# Patient Record
Sex: Male | Born: 1990 | Race: Black or African American | Hispanic: No | Marital: Single | Smoking: Never smoker
Health system: Southern US, Community
[De-identification: ages and names within clinical notes are randomized; demographics above are authoritative.]

## PROBLEM LIST (undated history)

## (undated) DIAGNOSIS — L309 Dermatitis, unspecified: Secondary | ICD-10-CM

---
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
## 2009-04-02 ENCOUNTER — Emergency Department (HOSPITAL_COMMUNITY): Admission: EM | Admit: 2009-04-02 | Discharge: 2009-04-03 | Payer: Self-pay | Admitting: Emergency Medicine

## 2009-04-13 ENCOUNTER — Inpatient Hospital Stay (HOSPITAL_COMMUNITY): Admission: EM | Admit: 2009-04-13 | Discharge: 2009-04-16 | Payer: Self-pay | Admitting: Emergency Medicine

## 2009-04-18 ENCOUNTER — Encounter (HOSPITAL_COMMUNITY): Admission: RE | Admit: 2009-04-18 | Discharge: 2009-05-18 | Payer: Self-pay | Admitting: Orthopaedic Surgery

## 2009-05-19 ENCOUNTER — Encounter (HOSPITAL_COMMUNITY): Admission: RE | Admit: 2009-05-19 | Discharge: 2009-06-18 | Payer: Self-pay | Admitting: Orthopaedic Surgery

## 2009-06-24 ENCOUNTER — Emergency Department (HOSPITAL_COMMUNITY): Admission: EM | Admit: 2009-06-24 | Discharge: 2009-06-24 | Payer: Self-pay | Admitting: Emergency Medicine

## 2010-05-16 LAB — CK TOTAL AND CKMB (NOT AT ARMC)
CK, MB: 3.6 ng/mL (ref 0.3–4.0)
Total CK: 465 U/L — ABNORMAL HIGH (ref 7–232)

## 2010-05-16 LAB — DIFFERENTIAL
Basophils Absolute: 0 10*3/uL (ref 0.0–0.1)
Basophils Relative: 0 % (ref 0–1)
Basophils Relative: 0 % (ref 0–1)
Eosinophils Absolute: 0.1 10*3/uL (ref 0.0–0.7)
Monocytes Relative: 10 % (ref 3–12)
Monocytes Relative: 14 % — ABNORMAL HIGH (ref 3–12)
Neutro Abs: 4.6 10*3/uL (ref 1.7–7.7)
Neutro Abs: 5.1 10*3/uL (ref 1.7–7.7)
Neutrophils Relative %: 59 % (ref 43–77)
Neutrophils Relative %: 63 % (ref 43–77)

## 2010-05-16 LAB — COMPREHENSIVE METABOLIC PANEL
Alkaline Phosphatase: 64 U/L (ref 39–117)
BUN: 16 mg/dL (ref 6–23)
CO2: 27 mEq/L (ref 19–32)
Calcium: 10 mg/dL (ref 8.4–10.5)
GFR calc non Af Amer: >60 mL/min (ref >60)
Glucose, Bld: 88 mg/dL (ref 70–99)
Potassium: 3.4 mEq/L — ABNORMAL LOW (ref 3.5–5.1)
Total Protein: 7.4 g/dL (ref 6.0–8.3)

## 2010-05-16 LAB — URINALYSIS, ROUTINE W REFLEX MICROSCOPIC
Hgb urine dipstick: NEGATIVE
Nitrite: NEGATIVE
Protein, ur: NEGATIVE mg/dL
Specific Gravity, Urine: 1.025 (ref 1.005–1.030)
Urobilinogen, UA: 1 mg/dL (ref 0.0–1.0)

## 2010-05-16 LAB — RAPID URINE DRUG SCREEN, HOSP PERFORMED
Amphetamines: NOT DETECTED
Barbiturates: NOT DETECTED
Benzodiazepines: NOT DETECTED
Opiates: NOT DETECTED

## 2010-05-16 LAB — CBC
MCHC: 33.1 g/dL (ref 30.0–36.0)
MCHC: 33.6 g/dL (ref 30.0–36.0)
MCV: 81.1 fL (ref 78.0–100.0)
RDW: 13.8 % (ref 11.5–15.5)
RDW: 13.9 % (ref 11.5–15.5)

## 2010-05-16 LAB — POCT CARDIAC MARKERS: Myoglobin, poc: >500 ng/mL (ref 12–200)

## 2010-05-16 LAB — ETHANOL: Alcohol, Ethyl (B): 116 mg/dL — ABNORMAL HIGH (ref 0–10)

## 2010-05-16 LAB — BASIC METABOLIC PANEL
BUN: 12 mg/dL (ref 6–23)
CO2: 30 mEq/L (ref 19–32)
Calcium: 8.9 mg/dL (ref 8.4–10.5)
Chloride: 107 mEq/L (ref 96–112)
Creatinine, Ser: 0.87 mg/dL (ref 0.4–1.5)
GFR calc Af Amer: >60 mL/min (ref >60)
Glucose, Bld: 99 mg/dL (ref 70–99)

## 2010-08-29 ENCOUNTER — Emergency Department (HOSPITAL_COMMUNITY)
Admission: EM | Admit: 2010-08-29 | Discharge: 2010-08-29 | Disposition: A | Discharge status: Home / Self Care | Payer: Self-pay | Attending: Emergency Medicine | Admitting: Emergency Medicine

## 2010-08-29 DIAGNOSIS — R51 Headache: Secondary | ICD-10-CM | POA: Insufficient documentation

## 2010-08-29 DIAGNOSIS — R42 Dizziness and giddiness: Secondary | ICD-10-CM | POA: Insufficient documentation

## 2010-08-29 DIAGNOSIS — R1110 Vomiting, unspecified: Secondary | ICD-10-CM | POA: Insufficient documentation

## 2010-08-29 LAB — DIFFERENTIAL
Basophils Absolute: 0 10*3/uL (ref 0.0–0.1)
Basophils Relative: 0 % (ref 0–1)
Eosinophils Absolute: 0.2 10*3/uL (ref 0.0–0.7)
Monocytes Absolute: 0.6 10*3/uL (ref 0.1–1.0)
Monocytes Relative: 14 % — ABNORMAL HIGH (ref 3–12)
Neutro Abs: 2.3 10*3/uL (ref 1.7–7.7)
Neutrophils Relative %: 51 % (ref 43–77)

## 2010-08-29 LAB — BASIC METABOLIC PANEL
Calcium: 9.4 mg/dL (ref 8.4–10.5)
GFR calc Af Amer: >60 mL/min (ref >60)
GFR calc non Af Amer: >60 mL/min (ref >60)
Potassium: 3.7 mEq/L (ref 3.5–5.1)
Sodium: 138 mEq/L (ref 135–145)

## 2010-08-29 LAB — CBC
Hemoglobin: 13 g/dL (ref 13.0–17.0)
MCH: 26.4 pg (ref 26.0–34.0)
MCHC: 32.7 g/dL (ref 30.0–36.0)
Platelets: 187 10*3/uL (ref 150–400)
RBC: 4.93 MIL/uL (ref 4.22–5.81)

## 2010-12-07 LAB — URINALYSIS, ROUTINE W REFLEX MICROSCOPIC
Glucose, UA: NEGATIVE
Hgb urine dipstick: NEGATIVE
Specific Gravity, Urine: 1.025
Urobilinogen, UA: 1

## 2010-12-07 LAB — DIFFERENTIAL
Lymphocytes Relative: 12 — ABNORMAL LOW
Monocytes Absolute: 0.6
Monocytes Relative: 8
Neutro Abs: 6.6
Neutrophils Relative %: 79 — ABNORMAL HIGH

## 2010-12-07 LAB — COMPREHENSIVE METABOLIC PANEL
Albumin: 4.3
BUN: 14
Calcium: 9.3
Creatinine, Ser: 1.03
Total Protein: 6.8

## 2010-12-07 LAB — CBC
HCT: 41.3
MCHC: 33.1
MCV: 80.1 — ABNORMAL LOW
Platelets: 199
RDW: 13.7

## 2011-02-07 ENCOUNTER — Encounter: Payer: Self-pay | Admitting: Emergency Medicine

## 2011-02-07 ENCOUNTER — Emergency Department (HOSPITAL_COMMUNITY)
Admission: EM | Admit: 2011-02-07 | Discharge: 2011-02-07 | Disposition: A | Discharge status: Home / Self Care | Payer: Self-pay | Attending: Emergency Medicine | Admitting: Emergency Medicine

## 2011-02-07 DIAGNOSIS — R110 Nausea: Secondary | ICD-10-CM | POA: Insufficient documentation

## 2011-02-07 DIAGNOSIS — R51 Headache: Secondary | ICD-10-CM | POA: Insufficient documentation

## 2011-02-07 MED ORDER — HYDROCODONE-ACETAMINOPHEN 5-325 MG PO TABS: 1.0000 | ORAL_TABLET | ORAL | Status: AC | PRN

## 2011-02-07 MED ORDER — HYDROMORPHONE HCL PF 1 MG/ML IJ SOLN
1.0000 mg | Freq: Once | INTRAMUSCULAR | Status: AC
  Administered 2011-02-07: 1 mg via INTRAMUSCULAR
  Filled 2011-02-07: qty 1

## 2011-02-07 MED ORDER — ONDANSETRON HCL 4 MG PO TABS: 4.0000 mg | ORAL_TABLET | Freq: Four times a day (QID) | ORAL | Status: AC

## 2011-02-07 MED ORDER — ONDANSETRON 8 MG PO TBDP
8.0000 mg | ORAL_TABLET | Freq: Once | ORAL | Status: AC
  Administered 2011-02-07: 8 mg via ORAL
  Filled 2011-02-07: qty 1

## 2011-02-07 NOTE — ED Provider Notes (Signed)
History  Scribed for Alexander Hutching, MD, the patient was seen in room APA08/APA08. This chart was scribed by Candelaria Stagers. The patient's care started at 6:25PM.    CSN: [id number] Arrival date & time: 02/07/2011  5:44 PM   First MD Initiated Contact with Patient 02/07/11 1808      Chief Complaint  Patient presents with  . Headache     The history is provided by the patient.   Alexander Acevedo is a 20 y.o. male who was BIBA complaining of a sharp stabbing headache around the left temple that has been intermittent over the last four days and has gotten worse today.  Pt has taken OTC sinus medication, Vicodin, and tylenol with no relief.  He states that the pain is a 10/10 and he does not regularly experience headaches.  He is experiencing no sinus pressure or pain.    History reviewed. No pertinent past medical history.  History reviewed. No pertinent past surgical history.  History reviewed. No pertinent family history.  History  Substance Use Topics  . Smoking status: Not on file  . Smokeless tobacco: Not on file  . Alcohol Use: Not on file      Review of Systems  Constitutional: Negative for fever and chills.  HENT: Negative for rhinorrhea, neck pain and sinus pressure.   Eyes: Negative for pain.  Respiratory: Negative for cough and shortness of breath.   Cardiovascular: Negative for chest pain.  Gastrointestinal: Positive for nausea. Negative for vomiting, abdominal pain and diarrhea.  Genitourinary: Negative for dysuria.  Musculoskeletal: Negative for back pain.  Skin: Negative for rash.  Neurological: Positive for headaches. Negative for dizziness and weakness.    Allergies  Review of patient's allergies indicates no known allergies.  Home Medications  No current outpatient prescriptions on file.  BP 125/77  Pulse 69  Temp(Src) 98.8 F (37.1 C) (Oral)  Resp 20  Wt 170 lb (77.111 kg)  SpO2 100%  Physical Exam  Nursing note and vitals  reviewed. Constitutional: He is oriented to person, place, and time. He appears well-developed and well-nourished. No distress.  HENT:  Head: Normocephalic and atraumatic.  Eyes: EOM are normal. Pupils are equal, round, and reactive to light.  Neck: Neck supple. No tracheal deviation present.  Cardiovascular: Normal rate.   Pulmonary/Chest: Effort normal. No respiratory distress.  Abdominal: Soft. He exhibits no distension.  Musculoskeletal: Normal range of motion. He exhibits no edema.  Neurological: He is alert and oriented to person, place, and time. No sensory deficit.  Skin: Skin is warm and dry.  Psychiatric: He has a normal mood and affect. His behavior is normal.    ED Course  Procedures   Will give Zofran for nausea and dilaudid injection for pain.    DIAGNOSTIC STUDIES: Oxygen Saturation is 100% on room air, normal by my interpretation.    COORDINATION OF CARE:  6:27PM ondansetron (ZOFRAN-ODT) disintegrating tablet 8 mg, HYDROmorphone (DILAUDID) injection 1 mg     Labs Reviewed - No data to display No results found.   No diagnosis found.    MDM  No neurological deficits on physical exam. Feeling better after injection for pain   I personally performed the services described in this documentation, which was scribed in my presence. The recorded information has been reviewed and considered.         Alexander Hutching, MD 02/07/11 [phone]

## 2011-02-07 NOTE — ED Notes (Signed)
Pt c/o l side ha x 4 days-worse when bending down. Pt c/o n/v today. nad noted.

## 2011-02-07 NOTE — ED Notes (Signed)
A&ox4; in no apparent distress; instructions given and reviewed-verbalizes understanding.

## 2011-02-07 NOTE — ED Notes (Signed)
Received report agree with previous assessment 

## 2011-05-23 ENCOUNTER — Encounter (HOSPITAL_COMMUNITY): Payer: Self-pay | Admitting: *Deleted

## 2011-05-23 ENCOUNTER — Emergency Department (HOSPITAL_COMMUNITY)
Admission: EM | Admit: 2011-05-23 | Discharge: 2011-05-23 | Disposition: A | Discharge status: Home / Self Care | Payer: Self-pay | Attending: Emergency Medicine | Admitting: Emergency Medicine

## 2011-05-23 DIAGNOSIS — L309 Dermatitis, unspecified: Secondary | ICD-10-CM

## 2011-05-23 DIAGNOSIS — L259 Unspecified contact dermatitis, unspecified cause: Secondary | ICD-10-CM | POA: Insufficient documentation

## 2011-05-23 HISTORY — DX: Dermatitis, unspecified: L30.9

## 2011-05-23 MED ORDER — TRIAMCINOLONE ACETONIDE 0.1 % EX LOTN: TOPICAL_LOTION | Freq: Three times a day (TID) | CUTANEOUS | Status: DC

## 2011-05-23 MED ORDER — DIPHENHYDRAMINE HCL 25 MG PO CAPS: 25.0000 mg | ORAL_CAPSULE | ORAL | Status: DC | PRN

## 2011-05-23 MED ORDER — PREDNISONE 20 MG PO TABS
60.0000 mg | ORAL_TABLET | Freq: Once | ORAL | Status: AC
  Administered 2011-05-23: 60 mg via ORAL
  Filled 2011-05-23: qty 3

## 2011-05-23 MED ORDER — DIPHENHYDRAMINE HCL 25 MG PO CAPS
25.0000 mg | ORAL_CAPSULE | Freq: Once | ORAL | Status: AC
  Administered 2011-05-23: 25 mg via ORAL
  Filled 2011-05-23: qty 1

## 2011-05-23 MED ORDER — PREDNISONE 20 MG PO TABS: ORAL_TABLET | ORAL | Status: DC

## 2011-05-23 NOTE — Discharge Instructions (Signed)
Eczema Atopic dermatitis, or eczema, is an inherited type of sensitive skin. Often people with eczema have a family history of allergies, asthma, or hay fever. It causes a red itchy rash and dry scaly skin. The itchiness may occur before the skin rash and may be very intense. It is not contagious. Eczema is generally worse during the cooler winter months and often improves with the warmth of summer. Eczema usually starts showing signs in infancy. Some children outgrow eczema, but it may last through adulthood. Flare-ups may be caused by:  Eating something or contact with something you are sensitive or allergic to.   Stress.  DIAGNOSIS  The diagnosis of eczema is usually based upon symptoms and medical history. TREATMENT  Eczema cannot be cured, but symptoms usually can be controlled with treatment or avoidance of allergens (things to which you are sensitive or allergic to).  Controlling the itching and scratching.   Use over-the-counter antihistamines as directed for itching. It is especially useful at night when the itching tends to be worse.   Use over-the-counter steroid creams as directed for itching.   Scratching makes the rash and itching worse and may cause impetigo (a skin infection) if fingernails are contaminated (dirty).   Keeping the skin well moisturized with creams every day. This will seal in moisture and help prevent dryness. Lotions containing alcohol and water can dry the skin and are not recommended.   Limiting exposure to allergens.   Recognizing situations that cause stress.   Developing a plan to manage stress.  HOME CARE INSTRUCTIONS   Take prescription and over-the-counter medicines as directed by your caregiver.   Do not use anything on the skin without checking with your caregiver.   Keep baths or showers short (5 minutes) in warm (not hot) water. Use mild cleansers for bathing. You may add non-perfumed bath oil to the bath water. It is best to avoid soap and  bubble bath.   Immediately after a bath or shower, when the skin is still damp, apply a moisturizing ointment to the entire body. This ointment should be a petroleum ointment. This will seal in moisture and help prevent dryness. The thicker the ointment the better. These should be unscented.   Keep fingernails cut short and wash hands often. If your child has eczema, it may be necessary to put soft gloves or mittens on your child at night.   Dress in clothes made of cotton or cotton blends. Dress lightly, as heat increases itching.   Avoid foods that may cause flare-ups. Common foods include cow's milk, peanut butter, eggs and wheat.   Keep a child with eczema away from anyone with fever blisters. The virus that causes fever blisters (herpes simplex) can cause a serious skin infection in children with eczema.  SEEK MEDICAL CARE IF:   Itching interferes with sleep.   The rash gets worse or is not better within one week following treatment.   The rash looks infected (pus or soft yellow scabs).   You or your child has an oral temperature above 102 F (38.9 C).   Your baby is older than 3 months with a rectal temperature of 100.5 F (38.1 C) or higher for more than 1 day.   The rash flares up after contact with someone who has fever blisters.  SEEK IMMEDIATE MEDICAL CARE IF:   Your baby is older than 3 months with a rectal temperature of 102 F (38.9 C) or higher.   Your baby is older than  3 months or younger with a rectal temperature of 100.4 F (38 C) or higher.  Document Released: 02/09/2000 Document Revised: 01/31/2011 Document Reviewed: 12/14/2008 [facility] Patient Information 2012 [place], [place].   Take your next dose of prednisone tomorrow.  Use the steroid cream only on those places such as hands and face that are bothering him the most, but use sparingly on the face and not more than 1 week on your facial skin.  Use Benadryl for itching.  Take cool showers and moisturizers  skin using Eucerin or Keri lotion before a towel drying.  Call Dr. Margo Aye for further management of your symptoms if they do not improve, or return here.

## 2011-05-23 NOTE — ED Notes (Signed)
Pt Dc to home with steady gait. 

## 2011-05-23 NOTE — ED Notes (Signed)
Eczema flare for weeks.

## 2011-05-24 NOTE — ED Provider Notes (Signed)
History     CSN: [id number]  Arrival date & time 05/23/11  1337   First MD Initiated Contact with Patient 05/23/11 1414      Chief Complaint  Patient presents with  . Rash    (Consider location/radiation/quality/duration/timing/severity/associated sxs/prior treatment) Patient is a 21 y.o. male presenting with rash. The history is provided by the patient.  Rash  This is a recurrent problem. The current episode started more than 1 week ago. The problem has been gradually worsening. Associated with: Patient reports reports chronic history of eczema, which has recently flared over the past several weeks. There has been no fever. The rash is present on the face, left wrist, left hand, left arm, neck, right arm, right hand, right wrist and trunk (He also has patches behind both his knees.). The pain is at a severity of 0/10. The patient is experiencing no pain. Associated symptoms include itching. Pertinent negatives include no blisters, no pain and no weeping. He has tried nothing for the symptoms.    Past Medical History  Diagnosis Date  . Eczema     History reviewed. No pertinent past surgical history.  Family History  Problem Relation Age of Onset  . Diabetes Mother     History  Substance Use Topics  . Smoking status: Never Smoker   . Smokeless tobacco: Not on file  . Alcohol Use: No      Review of Systems  Constitutional: Negative for fever.  HENT: Negative for congestion, sore throat and neck pain.   Eyes: Negative.   Respiratory: Negative for chest tightness and shortness of breath.   Cardiovascular: Negative for chest pain.  Gastrointestinal: Negative for nausea and abdominal pain.  Genitourinary: Negative.   Musculoskeletal: Negative for joint swelling and arthralgias.  Skin: Positive for itching and rash. Negative for wound.  Neurological: Negative for dizziness, weakness, light-headedness, numbness and headaches.  Hematological: Negative.     Psychiatric/Behavioral: Negative.     Allergies  Review of patient's allergies indicates no known allergies.  Home Medications   Current Outpatient Rx  Name Route Sig Dispense Refill  . ACETAMINOPHEN 500 MG PO TABS Oral Take 500 mg by mouth as needed. For pain     . DIPHENHYDRAMINE HCL 25 MG PO CAPS Oral Take 1 capsule (25 mg total) by mouth every 4 (four) hours as needed for itching. 30 capsule 0  . PREDNISONE 20 MG PO TABS  Take 6 tabs daily by mouth for 2 day,  Then 5 tabs daily for 2 days,  4 tabs daily for 2 days,  3 tabs daily for 2 days,  2 tabs daily for 2 days,  Then 1 tab daily for 2 days. 42 tablet 0  . SINUS RELIEF PO Oral Take 2 tablets by mouth daily as needed. For cold symptoms     . TRIAMCINOLONE ACETONIDE 0.1 % EX LOTN Topical Apply topically 3 (three) times daily. 60 mL 0    BP 128/67  Pulse 61  Temp(Src) 98.1 F (36.7 C) (Oral)  Resp 18  Ht 5\' 8"  (1.727 m)  Wt 180 lb (81.647 kg)  BMI 27.37 kg/m2  SpO2 100%  Physical Exam  Nursing note and vitals reviewed. Constitutional: He is oriented to person, place, and time. He appears well-developed and well-nourished.  HENT:  Head: Normocephalic and atraumatic.  Eyes: Conjunctivae are normal.  Neck: Neck supple.  Cardiovascular: Normal rate, regular rhythm and normal heart sounds.   Pulmonary/Chest: Effort normal and breath sounds normal. He has no  wheezes.  Musculoskeletal: Normal range of motion.  Neurological: He is alert and oriented to person, place, and time.  Skin: Skin is warm and dry. Rash noted. Rash is macular.       Scattered areas of raised and scaly plaques nearly confluent on neck wrists and hands and popliteal spaces.  Also involving entire face, which is more your edematous and scaling, with less intense plaques as seen on other areas.  Scattered excoriations present.  No erythema and no drainage noted, no papules pustules or vesicles noted.  Psychiatric: He has a normal mood and affect.    ED  Course  Procedures (including critical care time)  Labs Reviewed - No data to display No results found.   1. Eczema     Patient given Benadryl 25 mg by mouth and prednisone 60 mg by mouth.  MDM  Prescribed Benadryl, 12 date 60 mg  and prednisone taper, and also prescribed triamcinolone 0.1% cream to use very sparingly twice daily on the most troublesome patches.  He was referred to Dr. Margo Aye for further management if his eczema does not get under control with this treatment.  Also encouraged to take short cool showers, followed by copious moisturizer before drying off which may help also improve his rash.        Candis Musa, PA 05/24/11 0754  Candis Musa, PA 05/24/11 [phone]

## 2011-05-29 NOTE — ED Provider Notes (Signed)
Medical screening examination/treatment/procedure(s) were performed by non-physician practitioner and as supervising physician I was immediately available for consultation/collaboration.   Sondi Desch W Yidel Teuscher, MD 05/29/11 1423 

## 2011-05-30 ENCOUNTER — Encounter (HOSPITAL_COMMUNITY): Payer: Self-pay

## 2011-05-30 ENCOUNTER — Emergency Department (HOSPITAL_COMMUNITY)
Admission: EM | Admit: 2011-05-30 | Discharge: 2011-05-30 | Disposition: A | Discharge status: Home / Self Care | Payer: Self-pay | Attending: Emergency Medicine | Admitting: Emergency Medicine

## 2011-05-30 DIAGNOSIS — L259 Unspecified contact dermatitis, unspecified cause: Secondary | ICD-10-CM | POA: Insufficient documentation

## 2011-05-30 DIAGNOSIS — L309 Dermatitis, unspecified: Secondary | ICD-10-CM

## 2011-05-30 DIAGNOSIS — Z760 Encounter for issue of repeat prescription: Secondary | ICD-10-CM | POA: Insufficient documentation

## 2011-05-30 MED ORDER — TRIAMCINOLONE ACETONIDE 0.1 % EX CREA: TOPICAL_CREAM | Freq: Three times a day (TID) | CUTANEOUS | Status: DC

## 2011-05-30 NOTE — ED Provider Notes (Signed)
History     CSN: [id number]  Arrival date & time 05/30/11  4098   First MD Initiated Contact with Patient 05/30/11 1013      Chief Complaint  Patient presents with  . Eczema    (Consider location/radiation/quality/duration/timing/severity/associated sxs/prior treatment) HPI Comments: Patient seen here one week ago for same sx's and states he received a cream that was improving his symptoms but states he was prescribed a small amt of the cream and he has ran out.  He denies any worsening symptoms.  Patient has a hx of recurrent flare-ups of eczema.  He denies fever, drainage or edema  Patient is a 21 y.o. male presenting with rash. The history is provided by the patient and a parent.  Rash  This is a recurrent problem. The current episode started more than 2 days ago. The problem has been gradually improving. Associated with: hx of ecezma. There has been no fever. The rash is present on the face, left hand, right hand and trunk. The patient is experiencing no pain. Associated symptoms include itching. Pertinent negatives include no blisters, no pain and no weeping. He has tried steriods for the symptoms. The treatment provided mild relief.    Past Medical History  Diagnosis Date  . Eczema     History reviewed. No pertinent past surgical history.  Family History  Problem Relation Age of Onset  . Diabetes Mother     History  Substance Use Topics  . Smoking status: Never Smoker   . Smokeless tobacco: Not on file  . Alcohol Use: No      Review of Systems  Constitutional: Negative for fever.  HENT: Negative for facial swelling, neck pain and neck stiffness.   Musculoskeletal: Negative for joint swelling.  Skin: Positive for itching and rash.  Neurological: Negative for weakness and numbness.  All other systems reviewed and are negative.    Allergies  Review of patient's allergies indicates no known allergies.  Home Medications   Current Outpatient Rx  Name Route  Sig Dispense Refill  . ACETAMINOPHEN 500 MG PO TABS Oral Take 500 mg by mouth as needed. For pain     . DIPHENHYDRAMINE HCL 25 MG PO CAPS Oral Take 1 capsule (25 mg total) by mouth every 4 (four) hours as needed for itching. 30 capsule 0  . PREDNISONE 20 MG PO TABS  Take 6 tabs daily by mouth for 2 day,  Then 5 tabs daily for 2 days,  4 tabs daily for 2 days,  3 tabs daily for 2 days,  2 tabs daily for 2 days,  Then 1 tab daily for 2 days. 42 tablet 0  . SINUS RELIEF PO Oral Take 2 tablets by mouth daily as needed. For cold symptoms     . TRIAMCINOLONE ACETONIDE 0.1 % EX LOTN Topical Apply topically 3 (three) times daily. 60 mL 0    BP 117/64  Pulse 50  Temp(Src) 97.9 F (36.6 C) (Oral)  Resp 18  SpO2 100%  Physical Exam  Nursing note and vitals reviewed. Constitutional: He is oriented to person, place, and time. He appears well-developed and well-nourished. No distress.  HENT:  Head: Normocephalic and atraumatic.  Mouth/Throat: Oropharynx is clear and moist.  Cardiovascular: Normal rate, regular rhythm and normal heart sounds.   Pulmonary/Chest: Effort normal and breath sounds normal. No respiratory distress.  Musculoskeletal: Normal range of motion. He exhibits no edema and no tenderness.  Lymphadenopathy:    He has no cervical adenopathy.  Neurological:  He is alert and oriented to person, place, and time. He exhibits normal muscle tone. Coordination normal.  Skin: Skin is warm and dry. Rash noted.       Slightly raised plaques to the bilateral dorsal hands, arms and trunk.  Mild scaling also present.  No erythema or pustules    ED Course  Procedures (including critical care time)       MDM     Previous ED chart and nursing notes were reviewed by me.    Patient with hx of eczema was seen here last week and given triamcinolone cream.  Returns today for refill.  Has appt with dermatology, Dr. Margo Aye, in one week.    Patient / Family / Caregiver understand and agree with  initial ED impression and plan with expectations set for ED visit. Pt stable in ED with no significant deterioration in condition. Pt feels improved after observation and/or treatment in ED.        Imogean Ciampa L. Kreig Parson, [place] 06/04/11 1439

## 2011-05-30 NOTE — ED Notes (Signed)
Pt reports being here Saturday and was dx with eczema.  Pt reports running out of his cream that was prescribed to him.  Pt here to get another prescription.  Pt denies any itching or discomfort.

## 2011-05-30 NOTE — Discharge Instructions (Signed)

## 2011-06-07 NOTE — ED Provider Notes (Signed)
Medical screening examination/treatment/procedure(s) were performed by non-physician practitioner and as supervising physician I was immediately available for consultation/collaboration.   Darrell Leonhardt L Ariana Cavenaugh, MD 06/07/11 1817 

## 2012-04-24 ENCOUNTER — Emergency Department (HOSPITAL_COMMUNITY)
Admission: EM | Admit: 2012-04-24 | Discharge: 2012-04-24 | Disposition: A | Discharge status: Home / Self Care | Payer: Self-pay | Attending: Emergency Medicine | Admitting: Emergency Medicine

## 2012-04-24 ENCOUNTER — Encounter (HOSPITAL_COMMUNITY): Payer: Self-pay | Admitting: Emergency Medicine

## 2012-04-24 DIAGNOSIS — R6883 Chills (without fever): Secondary | ICD-10-CM | POA: Insufficient documentation

## 2012-04-24 DIAGNOSIS — Z872 Personal history of diseases of the skin and subcutaneous tissue: Secondary | ICD-10-CM | POA: Insufficient documentation

## 2012-04-24 DIAGNOSIS — R112 Nausea with vomiting, unspecified: Secondary | ICD-10-CM | POA: Insufficient documentation

## 2012-04-24 DIAGNOSIS — R109 Unspecified abdominal pain: Secondary | ICD-10-CM | POA: Insufficient documentation

## 2012-04-24 LAB — COMPREHENSIVE METABOLIC PANEL
ALT: 21 U/L (ref 0–53)
AST: 22 U/L (ref 0–37)
Alkaline Phosphatase: 83 U/L (ref 39–117)
CO2: 28 mEq/L (ref 19–32)
Calcium: 10 mg/dL (ref 8.4–10.5)
GFR calc Af Amer: >90 mL/min (ref >90)
Glucose, Bld: 117 mg/dL — ABNORMAL HIGH (ref 70–99)
Potassium: 3.6 mEq/L (ref 3.5–5.1)
Sodium: 137 mEq/L (ref 135–145)
Total Protein: 8.2 g/dL (ref 6.0–8.3)

## 2012-04-24 LAB — URINALYSIS, ROUTINE W REFLEX MICROSCOPIC
Glucose, UA: NEGATIVE mg/dL
Leukocytes, UA: NEGATIVE
Nitrite: NEGATIVE
pH: 8 (ref 5.0–8.0)

## 2012-04-24 LAB — CBC
Hemoglobin: 15.2 g/dL (ref 13.0–17.0)
MCH: 27 pg (ref 26.0–34.0)
MCHC: 33.9 g/dL (ref 30.0–36.0)
Platelets: 187 10*3/uL (ref 150–400)
RBC: 5.62 MIL/uL (ref 4.22–5.81)

## 2012-04-24 LAB — URINE MICROSCOPIC-ADD ON

## 2012-04-24 MED ORDER — MORPHINE SULFATE 4 MG/ML IJ SOLN
4.0000 mg | Freq: Once | INTRAMUSCULAR | Status: AC
  Administered 2012-04-24: 4 mg via INTRAVENOUS
  Filled 2012-04-24: qty 1

## 2012-04-24 MED ORDER — ONDANSETRON HCL 4 MG/2ML IJ SOLN
4.0000 mg | Freq: Once | INTRAMUSCULAR | Status: AC
  Administered 2012-04-24: 4 mg via INTRAVENOUS
  Filled 2012-04-24: qty 2

## 2012-04-24 MED ORDER — FAMOTIDINE IN NACL 20-0.9 MG/50ML-% IV SOLN
20.0000 mg | INTRAVENOUS | Status: AC
  Administered 2012-04-24: 20 mg via INTRAVENOUS
  Filled 2012-04-24: qty 50

## 2012-04-24 MED ORDER — SODIUM CHLORIDE 0.9 % IV BOLUS (SEPSIS)
1000.0000 mL | Freq: Once | INTRAVENOUS | Status: AC
  Administered 2012-04-24: 1000 mL via INTRAVENOUS

## 2012-04-24 MED ORDER — ONDANSETRON 4 MG PO TBDP: 4.0000 mg | ORAL_TABLET | Freq: Three times a day (TID) | ORAL | Status: DC | PRN

## 2012-04-24 MED ORDER — FAMOTIDINE 20 MG PO TABS: 20.0000 mg | ORAL_TABLET | Freq: Two times a day (BID) | ORAL | Status: DC

## 2012-04-24 NOTE — ED Notes (Signed)
Pt states that he is feeling better, updated on plan of care.

## 2012-04-24 NOTE — ED Notes (Signed)
Pt given crackers, ginger ale,

## 2012-04-24 NOTE — ED Provider Notes (Addendum)
History  This chart was scribed for Alexander Roller, MD by Shari Heritage, ED Scribe. The patient was seen in room APA07/APA07. Patient's care was started at 0754.   CSN: [id number]  Arrival date & time 04/24/12  1478   First MD Initiated Contact with Patient 04/24/12 [phone]      Chief Complaint  Patient presents with  . Nausea  . Emesis    The history is provided by the patient. No language interpreter was used.    HPI Comments: Alexander Acevedo is a 22 y.o. male who presents to the Emergency Department complaining of sudden epigastric abdominal pain and emesis that began last night. Abdominal pain is moderate, throbbing and non-radiating. He rates pain as 8/10. He says that abdominal pain began prior to vomiting. He reports 7 episodes of vomiting since symptom onset. Patient states that he felt normal when he woke up yesterday morning. He says that last night he ate a sandwich from [employer]. Patient had two reported glasses of an alcoholic drink but cannot recall exactly what he drank; he states that he normally does not drink. He denies any sick contacts and states everyone at home is healthy. He denies diarrhea, back pain, chest pain, shortness of breath, fever, difficulty urinating, testicular pain, hematuria, blood in stool, arthralgias, myalgias or rash. Patient does not smoke cigarettes.  Past Medical History  Diagnosis Date  . Eczema     History reviewed. No pertinent past surgical history.  Family History  Problem Relation Age of Onset  . Diabetes Mother     History  Substance Use Topics  . Smoking status: Never Smoker   . Smokeless tobacco: Not on file  . Alcohol Use: No      Review of Systems  Constitutional: Positive for chills. Negative for fever.  HENT: Negative for sore throat and neck pain.   Eyes: Negative for visual disturbance.  Respiratory: Negative for cough and shortness of breath.   Cardiovascular: Negative for chest pain.  Gastrointestinal: Positive for  vomiting and abdominal pain. Negative for nausea, diarrhea and blood in stool.  Genitourinary: Negative for dysuria, frequency, hematuria, difficulty urinating and testicular pain.  Musculoskeletal: Negative for myalgias, back pain and arthralgias.  Skin: Negative for rash.  Neurological: Negative for weakness, numbness and headaches.  Hematological: Negative for adenopathy.  Psychiatric/Behavioral: Negative for behavioral problems.  All other systems reviewed and are negative.    Allergies  Review of patient's allergies indicates no known allergies.  Home Medications   Current Outpatient Rx  Name  Route  Sig  Dispense  Refill  . famotidine (PEPCID) 20 MG tablet   Oral   Take 1 tablet (20 mg total) by mouth 2 (two) times daily.   30 tablet   0   . ondansetron (ZOFRAN ODT) 4 MG disintegrating tablet   Oral   Take 1 tablet (4 mg total) by mouth every 8 (eight) hours as needed for nausea.   10 tablet   0     Triage Vitals: BP 130/66  Pulse 104  Temp(Src) 98.9 F (37.2 C) (Oral)  Ht 5\' 8"  (1.727 m)  Wt 160 lb (72.576 kg)  BMI 24.33 kg/m2  SpO2 97%  Physical Exam  Constitutional: He is oriented to person, place, and time. He appears well-developed and well-nourished.  HENT:  Head: Normocephalic and atraumatic.  Mouth/Throat: Oropharynx is clear and moist.  Eyes: Conjunctivae are normal. Pupils are equal, round, and reactive to light. No scleral icterus.  Neck: Normal range  of motion. Neck supple.  Cardiovascular: Normal rate, regular rhythm and normal heart sounds.  Exam reveals no gallop and no friction rub.   No murmur heard. Pulmonary/Chest: Effort normal and breath sounds normal. No respiratory distress. He has no wheezes. He has no rales.  Abdominal: Soft. Bowel sounds are normal. He exhibits no distension. There is tenderness in the right upper quadrant and epigastric area. There is no rebound and no guarding.  Mild epigastric and RUQ tenderness. No peritoneal  signs. BS diminished.  Genitourinary:  No CVA tenderness.  Musculoskeletal: Normal range of motion.  Neurological: He is alert and oriented to person, place, and time.  Skin: Skin is warm and dry. No rash noted.    ED Course  Procedures (including critical care time) DIAGNOSTIC STUDIES: Oxygen Saturation is 97% on room air, adequate by my interpretation.    COORDINATION OF CARE: 8:20 AM- Patient informed of current plan for treatment and evaluation and agrees with plan at this time.      Labs Reviewed  URINALYSIS, ROUTINE W REFLEX MICROSCOPIC - Abnormal; Notable for the following:    Protein, ur TRACE (*)    All other components within normal limits  CBC - Abnormal; Notable for the following:    WBC 11.7 (*)    All other components within normal limits  COMPREHENSIVE METABOLIC PANEL - Abnormal; Notable for the following:    Glucose, Bld 117 (*)    All other components within normal limits  URINE MICROSCOPIC-ADD ON  LIPASE, BLOOD   No results found.   1. Nausea and vomiting       MDM  22 yo male with abdominal pain and vomiting after consuming unknown quantity of ethanol. DDx: gastritis, pancreatitis, viral gastroenteritis, cannot rule out early appy but less likely on differential. UA negative. Labs, fluids, pain/nausea control, Pepcid, reassess. Patient needs to be seen again in 24 hours if discharged for repeat abdominal examination.  Labs normal - pt improved significantly with lfuids and meds, stable for d/c.  Doubt acute abdominal pathology.   I personally performed the services described in this documentation, which was scribed in my presence. The recorded information has been reviewed and is accurate.     Alexander Roller, MD 04/24/12 1017  Alexander Roller, MD 04/24/12 1018

## 2012-04-24 NOTE — ED Notes (Signed)
N/v since last night. Pt states sister sick with same.denies diarrhea, fever.

## 2012-04-24 NOTE — ED Notes (Signed)
Pt presents to er with c/o n/v left lower abd pain, chills that started last night, advises that his sister is sick with same symptoms, last bowel movement was yesterday prior to the n/v starting and normal per pt.

## 2012-11-16 ENCOUNTER — Emergency Department (HOSPITAL_COMMUNITY)
Admission: EM | Admit: 2012-11-16 | Discharge: 2012-11-16 | Disposition: A | Discharge status: Home / Self Care | Payer: Self-pay | Attending: Emergency Medicine | Admitting: Emergency Medicine

## 2012-11-16 ENCOUNTER — Encounter (HOSPITAL_COMMUNITY): Payer: Self-pay | Admitting: *Deleted

## 2012-11-16 DIAGNOSIS — Z79899 Other long term (current) drug therapy: Secondary | ICD-10-CM | POA: Insufficient documentation

## 2012-11-16 DIAGNOSIS — R112 Nausea with vomiting, unspecified: Secondary | ICD-10-CM | POA: Insufficient documentation

## 2012-11-16 DIAGNOSIS — Z872 Personal history of diseases of the skin and subcutaneous tissue: Secondary | ICD-10-CM | POA: Insufficient documentation

## 2012-11-16 DIAGNOSIS — R197 Diarrhea, unspecified: Secondary | ICD-10-CM | POA: Insufficient documentation

## 2012-11-16 MED ORDER — PROMETHAZINE HCL 25 MG PO TABS: 25.0000 mg | ORAL_TABLET | Freq: Four times a day (QID) | ORAL | Status: DC | PRN

## 2012-11-16 MED ORDER — LOPERAMIDE HCL 2 MG PO CAPS: 2.0000 mg | ORAL_CAPSULE | Freq: Four times a day (QID) | ORAL | Status: DC | PRN

## 2012-11-16 MED ORDER — LOPERAMIDE HCL 2 MG PO CAPS
2.0000 mg | ORAL_CAPSULE | Freq: Once | ORAL | Status: AC
  Administered 2012-11-16: 2 mg via ORAL
  Filled 2012-11-16: qty 1

## 2012-11-16 MED ORDER — SODIUM CHLORIDE 0.9 % IV BOLUS (SEPSIS)
1000.0000 mL | Freq: Once | INTRAVENOUS | Status: AC
  Administered 2012-11-16: 1000 mL via INTRAVENOUS

## 2012-11-16 MED ORDER — BISMUTH SUBSALICYLATE 262 MG/15ML PO SUSP
30.0000 mL | Freq: Once | ORAL | Status: AC
  Administered 2012-11-16: 30 mL via ORAL
  Filled 2012-11-16: qty 236

## 2012-11-16 MED ORDER — BISMUTH SUBSALICYLATE 262 MG/15ML PO SUSP
ORAL | Status: AC
  Filled 2012-11-16: qty 236

## 2012-11-16 NOTE — ED Provider Notes (Signed)
CSN: [id number]     Arrival date & time 11/16/12  1714 History  This chart was scribed for [employer]. Rubin Payor, MD by Quintella Reichert, ED scribe.  This patient was seen in room APA03/[facility]03 and the patient's care was started at 7:13 PM.  Chief Complaint: Diarrhea  The history is provided by the patient. No language interpreter was used.    HPI Comments: Alexander Acevedo is a 22 y.o. male who presents to the Emergency Department complaining of persistent moderate diarrhea that began yesterday evening after eating pizza.  Stool is described as watery diarrhea.  Pt denies blood in stool.  He states he is able to eat and drink but "everything I eat is coming out."  He also states he has vomited one time.  He denies nausea, fever, chills, lightheadedness or dizziness.  He denies recent sick contacts or travel.   Past Medical History  Diagnosis Date  . Eczema     History reviewed. No pertinent past surgical history.   Family History  Problem Relation Age of Onset  . Diabetes Mother     History  Substance Use Topics  . Smoking status: Never Smoker   . Smokeless tobacco: Not on file  . Alcohol Use: No     Review of Systems  Constitutional: Negative for fever and chills.  Gastrointestinal: Positive for vomiting and diarrhea. Negative for nausea.  Neurological: Negative for dizziness and light-headedness.  All other systems reviewed and are negative.     Allergies  Review of patient's allergies indicates no known allergies.  Home Medications   Current Outpatient Rx  Name  Route  Sig  Dispense  Refill  . loperamide (IMODIUM) 2 MG capsule   Oral   Take 1 capsule (2 mg total) by mouth 4 (four) times daily as needed for diarrhea or loose stools.   12 capsule   0   . promethazine (PHENERGAN) 25 MG tablet   Oral   Take 1 tablet (25 mg total) by mouth every 6 (six) hours as needed for nausea.   10 tablet   0     BP 112/68  Pulse 50  Temp(Src) 98.9 F (37.2 C) (Oral)   Resp 20  Ht 5\' 6"  (1.676 m)  Wt 150 lb (68.04 kg)  BMI 24.22 kg/m2  SpO2 100%  Physical Exam  Nursing note and vitals reviewed. Constitutional: He is oriented to person, place, and time. He appears well-developed and well-nourished. No distress.  Well-appearing  HENT:  Head: Normocephalic and atraumatic.  Eyes: EOM are normal.  Neck: Neck supple. No tracheal deviation present.  Cardiovascular: Normal rate, regular rhythm and normal heart sounds.   No murmur heard. Pulmonary/Chest: Effort normal and breath sounds normal. No respiratory distress. He has no wheezes. He has no rales.  Abdominal: Soft. He exhibits no distension. There is no tenderness. There is no rebound.  Hyperactive bowel sounds  Musculoskeletal: Normal range of motion.  Neurological: He is alert and oriented to person, place, and time.  Skin: Skin is warm and dry.  Psychiatric: He has a normal mood and affect. His behavior is normal.    ED Course  Procedures (including critical care time)  COORDINATION OF CARE: 7:16 PM-Discussed treatment plan which includes IV fluids and anti-emetics with pt at bedside and pt agreed to plan.    Labs Review Labs Reviewed - No data to display  Imaging Review No results found.  MDM   1. Diarrhea    Patient with nausea and  one episode of vomiting but has been having diarrhea. He states everything he eats his coming out. He is a rather benign exam. He is tolerated orals and feels much better after IV fluids. He'll be discharged home with Imodium and Phenergan. He'll followup as needed    I personally performed the services described in this documentation, which was scribed in my presence. The recorded information has been reviewed and is accurate.     Juliet Rude. Rubin Payor, MD 11/16/12 2100

## 2012-11-16 NOTE — ED Notes (Signed)
Diarrhea since yesterday,after eating pizza,  abd pain, vomited x1

## 2012-11-16 NOTE — ED Notes (Signed)
Pt alert & oriented x4, stable gait. Patient given discharge instructions, paperwork & prescription(s). Patient  instructed to stop at the registration desk to finish any additional paperwork. Patient verbalized understanding. Pt left department w/ no further questions. 

## 2012-11-16 NOTE — ED Notes (Signed)
AC called for medication 

## 2013-05-05 ENCOUNTER — Encounter (HOSPITAL_COMMUNITY): Payer: Self-pay | Admitting: Emergency Medicine

## 2013-05-05 ENCOUNTER — Emergency Department (HOSPITAL_COMMUNITY)
Admission: EM | Admit: 2013-05-05 | Discharge: 2013-05-05 | Disposition: A | Discharge status: Home / Self Care | Payer: Self-pay | Attending: Emergency Medicine | Admitting: Emergency Medicine

## 2013-05-05 ENCOUNTER — Emergency Department (HOSPITAL_COMMUNITY): Payer: Self-pay

## 2013-05-05 DIAGNOSIS — X500XXA Overexertion from strenuous movement or load, initial encounter: Secondary | ICD-10-CM | POA: Insufficient documentation

## 2013-05-05 DIAGNOSIS — Y9389 Activity, other specified: Secondary | ICD-10-CM | POA: Insufficient documentation

## 2013-05-05 DIAGNOSIS — Y929 Unspecified place or not applicable: Secondary | ICD-10-CM | POA: Insufficient documentation

## 2013-05-05 DIAGNOSIS — S20211A Contusion of right front wall of thorax, initial encounter: Secondary | ICD-10-CM

## 2013-05-05 DIAGNOSIS — Z872 Personal history of diseases of the skin and subcutaneous tissue: Secondary | ICD-10-CM | POA: Insufficient documentation

## 2013-05-05 DIAGNOSIS — S20219A Contusion of unspecified front wall of thorax, initial encounter: Secondary | ICD-10-CM | POA: Insufficient documentation

## 2013-05-05 MED ORDER — HYDROCODONE-ACETAMINOPHEN 5-325 MG PO TABS: 2.0000 | ORAL_TABLET | ORAL | Status: DC | PRN

## 2013-05-05 MED ORDER — HYDROCODONE-ACETAMINOPHEN 5-325 MG PO TABS
2.0000 | ORAL_TABLET | Freq: Once | ORAL | Status: AC
  Administered 2013-05-05: 2 via ORAL
  Filled 2013-05-05: qty 2
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
## 2013-05-05 NOTE — Discharge Instructions (Signed)
Chest Contusion °A chest contusion is a deep bruise on your chest area. Contusions are the result of an injury that caused bleeding under the skin. A chest contusion may involve bruising of the skin, muscles, or ribs. The contusion may turn blue, purple, or yellow. Minor injuries will give you a painless contusion, but more severe contusions may stay painful and swollen for a few weeks. °CAUSES  °A contusion is usually caused by a blow, trauma, or direct force to an area of the body. °SYMPTOMS  °· Swelling and redness of the injured area. °· Discoloration of the injured area. °· Tenderness and soreness of the injured area. °· Pain. °DIAGNOSIS  °The diagnosis can be made by taking a history and performing a physical exam. An X-ray, CT scan, or MRI may be needed to determine if there were any associated injuries, such as broken bones (fractures) or internal injuries. °TREATMENT  °Often, the best treatment for a chest contusion is resting, icing, and applying cold compresses to the injured area. Deep breathing exercises may be recommended to reduce the risk of pneumonia. Over-the-counter medicines may also be recommended for pain control. °HOME CARE INSTRUCTIONS  °· Put ice on the injured area. °· Put ice in a plastic bag. °· Place a towel between your skin and the bag. °· Leave the ice on for 15-20 minutes, 03-04 times a day. °· Only take over-the-counter or prescription medicines as directed by your caregiver. Your caregiver may recommend avoiding anti-inflammatory medicines (aspirin, ibuprofen, and naproxen) for 48 hours because these medicines may increase bruising. °· Rest the injured area. °· Perform deep-breathing exercises as directed by your caregiver. °· Stop smoking if you smoke. °· Do not lift objects over 5 pounds (2.3 kg) for 3 days or longer if recommended by your caregiver. °SEEK IMMEDIATE MEDICAL CARE IF:  °· You have increased bruising or swelling. °· You have pain that is getting worse. °· You have  difficulty breathing. °· You have dizziness, weakness, or fainting. °· You have blood in your urine or stool. °· You cough up or vomit blood. °· Your swelling or pain is not relieved with medicines. °MAKE SURE YOU:  °· Understand these instructions. °· Will watch your condition. °· Will get help right away if you are not doing well or get worse. °Document Released: 11/06/2000 Document Revised: 11/06/2011 Document Reviewed: 08/05/2011 °[employer]® Patient Information ©2014 [employer]. ° °
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
## 2014-03-09 ENCOUNTER — Encounter (HOSPITAL_COMMUNITY): Payer: Self-pay

## 2014-03-09 ENCOUNTER — Emergency Department (HOSPITAL_COMMUNITY)
Admission: EM | Admit: 2014-03-09 | Discharge: 2014-03-09 | Disposition: A | Discharge status: Home / Self Care | Payer: Self-pay | Attending: Emergency Medicine | Admitting: Emergency Medicine

## 2014-03-09 DIAGNOSIS — K529 Noninfective gastroenteritis and colitis, unspecified: Secondary | ICD-10-CM | POA: Insufficient documentation

## 2014-03-09 MED ORDER — PANTOPRAZOLE SODIUM 40 MG IV SOLR
INTRAVENOUS | Status: AC
  Filled 2014-03-09: qty 40

## 2014-03-09 MED ORDER — ONDANSETRON HCL 4 MG/2ML IJ SOLN
INTRAMUSCULAR | Status: AC
  Filled 2014-03-09: qty 2

## 2014-03-09 NOTE — ED Notes (Signed)
Patient states vomiting X3 hours and abdominal pain. Patient denies diarrhea

## 2014-03-15 MED FILL — Ondansetron HCl Tab 4 MG: ORAL | Qty: 4 | Status: AC

## 2014-08-14 ENCOUNTER — Emergency Department (HOSPITAL_COMMUNITY): Payer: Self-pay

## 2014-08-14 ENCOUNTER — Emergency Department (HOSPITAL_COMMUNITY)
Admission: EM | Admit: 2014-08-14 | Discharge: 2014-08-14 | Disposition: A | Discharge status: Home / Self Care | Payer: Self-pay | Attending: Emergency Medicine | Admitting: Emergency Medicine

## 2014-08-14 ENCOUNTER — Encounter (HOSPITAL_COMMUNITY): Payer: Self-pay | Admitting: *Deleted

## 2014-08-14 DIAGNOSIS — E860 Dehydration: Secondary | ICD-10-CM | POA: Insufficient documentation

## 2014-08-14 DIAGNOSIS — Z872 Personal history of diseases of the skin and subcutaneous tissue: Secondary | ICD-10-CM | POA: Insufficient documentation

## 2014-08-14 DIAGNOSIS — R109 Unspecified abdominal pain: Secondary | ICD-10-CM

## 2014-08-14 DIAGNOSIS — R197 Diarrhea, unspecified: Secondary | ICD-10-CM

## 2014-08-14 DIAGNOSIS — R112 Nausea with vomiting, unspecified: Secondary | ICD-10-CM

## 2014-08-14 LAB — CBC WITH DIFFERENTIAL/PLATELET
BASOS ABS: 0 10*3/uL (ref 0.0–0.1)
BASOS PCT: 0 % (ref 0–1)
EOS ABS: 0.1 10*3/uL (ref 0.0–0.7)
EOS PCT: 1 % (ref 0–5)
HEMATOCRIT: 47.8 % (ref 39.0–52.0)
Hemoglobin: 16.3 g/dL (ref 13.0–17.0)
LYMPHS PCT: 23 % (ref 12–46)
Lymphs Abs: 1.2 10*3/uL (ref 0.7–4.0)
MCH: 27.2 pg (ref 26.0–34.0)
MCHC: 34.1 g/dL (ref 30.0–36.0)
MCV: 79.8 fL (ref 78.0–100.0)
MONO ABS: 0.7 10*3/uL (ref 0.1–1.0)
Monocytes Relative: 13 % — ABNORMAL HIGH (ref 3–12)
Neutro Abs: 3.4 10*3/uL (ref 1.7–7.7)
Neutrophils Relative %: 63 % (ref 43–77)
PLATELETS: 182 10*3/uL (ref 150–400)
RBC: 5.99 MIL/uL — ABNORMAL HIGH (ref 4.22–5.81)
RDW: 13.5 % (ref 11.5–15.5)
WBC: 5.4 10*3/uL (ref 4.0–10.5)

## 2014-08-14 LAB — COMPREHENSIVE METABOLIC PANEL
ALK PHOS: 80 U/L (ref 38–126)
ALT: 18 U/L (ref 17–63)
ANION GAP: 12 (ref 5–15)
AST: 23 U/L (ref 15–41)
Albumin: 4.6 g/dL (ref 3.5–5.0)
BUN: 23 mg/dL — AB (ref 6–20)
CHLORIDE: 100 mmol/L — AB (ref 101–111)
CO2: 27 mmol/L (ref 22–32)
CREATININE: 1.15 mg/dL (ref 0.61–1.24)
Calcium: 9.5 mg/dL (ref 8.9–10.3)
GLUCOSE: 97 mg/dL (ref 65–99)
Potassium: 3.9 mmol/L (ref 3.5–5.1)
SODIUM: 139 mmol/L (ref 135–145)
TOTAL PROTEIN: 8.5 g/dL — AB (ref 6.5–8.1)
Total Bilirubin: 0.9 mg/dL (ref 0.3–1.2)

## 2014-08-14 LAB — URINALYSIS, ROUTINE W REFLEX MICROSCOPIC
Glucose, UA: NEGATIVE mg/dL
HGB URINE DIPSTICK: NEGATIVE
KETONES UR: 15 mg/dL — AB
Leukocytes, UA: NEGATIVE
NITRITE: NEGATIVE
PH: 5.5 (ref 5.0–8.0)
PROTEIN: 30 mg/dL — AB
Specific Gravity, Urine: >1.03 — ABNORMAL HIGH (ref 1.005–1.030)
Urobilinogen, UA: 0.2 mg/dL (ref 0.0–1.0)

## 2014-08-14 LAB — URINE MICROSCOPIC-ADD ON

## 2014-08-14 MED ORDER — SODIUM CHLORIDE 0.9 % IV SOLN
1000.0000 mL | Freq: Once | INTRAVENOUS | Status: AC
  Administered 2014-08-14: 1000 mL via INTRAVENOUS

## 2014-08-14 MED ORDER — ONDANSETRON HCL 4 MG/2ML IJ SOLN
4.0000 mg | Freq: Once | INTRAMUSCULAR | Status: AC
  Administered 2014-08-14: 4 mg via INTRAVENOUS
  Filled 2014-08-14: qty 2

## 2014-08-14 MED ORDER — IOHEXOL 300 MG/ML  SOLN
100.0000 mL | Freq: Once | INTRAMUSCULAR | Status: AC | PRN
  Administered 2014-08-14: 100 mL via INTRAVENOUS

## 2014-08-14 MED ORDER — SODIUM CHLORIDE 0.9 % IV BOLUS (SEPSIS)
1000.0000 mL | Freq: Once | INTRAVENOUS | Status: AC
  Administered 2014-08-14: 1000 mL via INTRAVENOUS

## 2014-08-14 MED ORDER — IOHEXOL 300 MG/ML  SOLN
50.0000 mL | Freq: Once | INTRAMUSCULAR | Status: AC | PRN
  Administered 2014-08-14: 50 mL via ORAL

## 2014-08-14 MED ORDER — PROMETHAZINE HCL 25 MG PO TABS: 25.0000 mg | ORAL_TABLET | Freq: Four times a day (QID) | ORAL | Status: DC | PRN

## 2014-08-14 MED ORDER — FENTANYL CITRATE (PF) 100 MCG/2ML IJ SOLN
50.0000 ug | Freq: Once | INTRAMUSCULAR | Status: AC
  Administered 2014-08-14: 50 ug via INTRAVENOUS
  Filled 2014-08-14: qty 2

## 2014-08-14 MED ORDER — SODIUM CHLORIDE 0.9 % IV SOLN: 1000.0000 mL | INTRAVENOUS | Status: DC

## 2014-08-14 NOTE — Discharge Instructions (Signed)
Drink plenty of fluids (clear liquids) the next 12-24 hours then start a bland diet, such as toast, crackers, jello. Use the zofran for nausea or vomiting. Take imodium OTC for diarrhea. Avoid mild products until the diarrhea is gone. Recheck if you get worse.    Nausea and Vomiting Nausea means you feel sick to your stomach. Throwing up (vomiting) is a reflex where stomach contents come out of your mouth. HOME CARE   Take medicine as told by your doctor.  Do not force yourself to eat. However, you do need to drink fluids.  If you feel like eating, eat a normal diet as told by your doctor.  Eat rice, wheat, potatoes, bread, lean meats, yogurt, fruits, and vegetables.  Avoid high-fat foods.  Drink enough fluids to keep your pee (urine) clear or pale yellow.  Ask your doctor how to replace body fluid losses (rehydrate). Signs of body fluid loss (dehydration) include:  Feeling very thirsty.  Dry lips and mouth.  Feeling dizzy.  Dark pee.  Peeing less than normal.  Feeling confused.  Fast breathing or heart rate. GET HELP RIGHT AWAY IF:   You have blood in your throw up.  You have black or bloody poop (stool).  You have a bad headache or stiff neck.  You feel confused.  You have bad belly (abdominal) pain.  You have chest pain or trouble breathing.  You do not pee at least once every 8 hours.  You have cold, clammy skin.  You keep throwing up after 24 to 48 hours.  You have a fever. MAKE SURE YOU:   Understand these instructions.  Will watch your condition.  Will get help right away if you are not doing well or get worse. Document Released: 07/31/2007 Document Revised: 05/06/2011 Document Reviewed: 07/13/2010 [facility] Patient Information 2015 [place], [place]. This information is not intended to replace advice given to you by your health care provider. Make sure you discuss any questions you have with your health care provider.  Diarrhea Diarrhea is  watery poop (stool). It can make you feel weak, tired, thirsty, or give you a dry mouth (signs of dehydration). Watery poop is a sign of another problem, most often an infection. It often lasts 2-3 days. It can last longer if it is a sign of something serious. Take care of yourself as told by your doctor. HOME CARE   Drink 1 cup (8 ounces) of fluid each time you have watery poop.  Do not drink the following fluids:  Those that contain simple sugars (fructose, glucose, galactose, lactose, sucrose, maltose).  Sports drinks.  Fruit juices.  Whole milk products.  Sodas.  Drinks with caffeine (coffee, tea, soda) or alcohol.  Oral rehydration solution may be used if the doctor says it is okay. You may make your own solution. Follow this recipe:   - teaspoon table salt.   teaspoon baking soda.   teaspoon salt substitute containing potassium chloride.  1 tablespoons sugar.  1 liter (34 ounces) of water.  Avoid the following foods:  High fiber foods, such as raw fruits and vegetables.  Nuts, seeds, and whole grain breads and cereals.   Those that are sweetened with sugar alcohols (xylitol, sorbitol, mannitol).  Try eating the following foods:  Starchy foods, such as rice, toast, pasta, low-sugar cereal, oatmeal, baked potatoes, crackers, and bagels.  Bananas.  Applesauce.  Eat probiotic-rich foods, such as yogurt and milk products that are fermented.  Wash your hands well after each time you  have watery poop.  Only take medicine as told by your doctor.  Take a warm bath to help lessen burning or pain from having watery poop. GET HELP RIGHT AWAY IF:   You cannot drink fluids without throwing up (vomiting).  You keep throwing up.  You have blood in your poop, or your poop looks black and tarry.  You do not pee (urinate) in 6-8 hours, or there is only a small amount of very dark pee.  You have belly (abdominal) pain that gets worse or stays in the same spot  (localizes).  You are weak, dizzy, confused, or light-headed.  You have a very bad headache.  Your watery poop gets worse or does not get better.  You have a fever or lasting symptoms for more than 2-3 days.  You have a fever and your symptoms suddenly get worse. MAKE SURE YOU:   Understand these instructions.  Will watch your condition.  Will get help right away if you are not doing well or get worse. Document Released: 07/31/2007 Document Revised: 06/28/2013 Document Reviewed: 10/20/2011 [facility] Patient Information 2015 [place], [place]. This information is not intended to replace advice given to you by your health care provider. Make sure you discuss any questions you have with your health care provider.

## 2014-08-14 NOTE — ED Notes (Signed)
Pt c/o n/v/d, chills, abd pain that started yesterday,

## 2014-08-14 NOTE — ED Provider Notes (Signed)
CSN: [id number]     Arrival date & time 08/14/14  0033 History  This chart was scribed for Devoria Albe, MD by Doreatha Martin, ED Scribe. This patient was seen in room APA08/APA08 and the patient's care was started at 12:50 AM.     Chief Complaint  Patient presents with  . Emesis   The history is provided by the patient. No language interpreter was used.    HPI Comments: Alexander Acevedo is a 24 y.o. male who presents to the Emergency Department complaining of multiple episodes of diarrhea (4 episodes today) and vomiting (8 times today) onset 2 days ago. He states that his stool is loose, not watery. Pt reports that he vomited first before experiencing abdominal pain. He states associated nausea, decreased urine output, generalized weakness, chills and right abdominal pain. He states that pain is worsened when vomiting, bending over, hitting bumps while riding in the car and drinking. Pt states he ate crab legs 3 days ago, and his brother ate them as well- but he has not had any issues. Pt is a non-smoker and a non-drinker. He states that he is not currently working. He denies sick contact or recent antibiotics. He also denies blood in vomit, blood in diarrhea, dizziness or lightheadedness. He is unsure of fever but states he does have chills.  PCP none  Past Medical History  Diagnosis Date  . Eczema    History reviewed. No pertinent past surgical history. Family History  Problem Relation Age of Onset  . Diabetes Mother    History  Substance Use Topics  . Smoking status: Never Smoker   . Smokeless tobacco: Not on file  . Alcohol Use: No    Review of Systems  Constitutional: Positive for chills. Negative for fever.  Gastrointestinal: Positive for nausea, vomiting, abdominal pain and diarrhea. Negative for blood in stool.  Genitourinary: Positive for decreased urine volume.  Neurological: Positive for weakness. Negative for dizziness and light-headedness.  All other systems reviewed and are  negative.  Allergies  Review of patient's allergies indicates no known allergies.  Home Medications   Prior to Admission medications   Medication Sig Start Date End Date Taking? Authorizing Provider  HYDROcodone-acetaminophen (NORCO/VICODIN) 5-325 MG per tablet Take 2 tablets by mouth every 4 (four) hours as needed for moderate pain. 05/05/13   Gilda Crease, MD  promethazine (PHENERGAN) 25 MG tablet Take 1 tablet (25 mg total) by mouth every 6 (six) hours as needed for nausea or vomiting. 08/14/14   Devoria Albe, MD   Triage VS: BP 131/84 mmHg  Pulse 61  Temp(Src) 98.6 F (37 C) (Oral)  Resp 16  Ht  (1.702 m)  Wt 165 lb (74.844 kg)  BMI 25.84 kg/m2  SpO2 100%  Vital signs normal   Physical Exam  Constitutional: He is oriented to person, place, and time. He appears well-developed and well-nourished.  Non-toxic appearance. He does not appear ill. No distress.  HENT:  Head: Normocephalic and atraumatic.  Right Ear: External ear normal.  Left Ear: External ear normal.  Nose: Nose normal. No mucosal edema or rhinorrhea.  Mouth/Throat: Oropharynx is clear and moist and mucous membranes are normal. No dental abscesses or uvula swelling.  Dry mucous membranes  Eyes: Conjunctivae and EOM are normal. Pupils are equal, round, and reactive to light.  Neck: Normal range of motion and full passive range of motion without pain. Neck supple.  Cardiovascular: Normal rate, regular rhythm and normal heart sounds.  Exam reveals  no gallop and no friction rub.   No murmur heard. Pulmonary/Chest: Effort normal and breath sounds normal. No respiratory distress. He has no wheezes. He has no rhonchi. He has no rales. He exhibits no tenderness and no crepitus.  Abdominal: Soft. Normal appearance and bowel sounds are normal. He exhibits no distension. There is tenderness. There is no rebound and no guarding.    Tender over the whole right side of the abdomen, unable to say if any area was worse  than the other   Musculoskeletal: Normal range of motion. He exhibits no edema or tenderness.  Moves all extremities well.   Neurological: He is alert and oriented to person, place, and time. He has normal strength. No cranial nerve deficit.  Skin: Skin is warm, dry and intact. No rash noted. No erythema. No pallor.  Psychiatric: He has a normal mood and affect. His speech is normal and behavior is normal. His mood appears not anxious.  Nursing note and vitals reviewed.  ED Course  Procedures (including critical care time)  Medications  0.9 %  sodium chloride infusion (0 mLs Intravenous Stopped 08/14/14 0228)    Followed by  0.9 %  sodium chloride infusion (0 mLs Intravenous Stopped 08/14/14 0344)    Followed by  0.9 %  sodium chloride infusion (not administered)  fentaNYL (SUBLIMAZE) injection 50 mcg (50 mcg Intravenous Given 08/14/14 0108)  ondansetron (ZOFRAN) injection 4 mg (4 mg Intravenous Given 08/14/14 0108)  iohexol (OMNIPAQUE) 300 MG/ML solution 50 mL (50 mLs Oral Contrast Given 08/14/14 0202)  iohexol (OMNIPAQUE) 300 MG/ML solution 100 mL (100 mLs Intravenous Contrast Given 08/14/14 0202)  sodium chloride 0.9 % bolus 1,000 mL (0 mLs Intravenous Stopped 08/14/14 0422)  sodium chloride 0.9 % bolus 1,000 mL (1,000 mLs Intravenous New Bag/Given 08/14/14 0346)    DIAGNOSTIC STUDIES: Oxygen Saturation is 100% on RA, normal by my interpretation.    COORDINATION OF CARE: 12:58 AM Discussed treatment plan with pt at bedside and pt agreed to plan.  Patient was given IV fluids and IV pain and nausea medication. We discussed doing a CT scan do the right-sided abdominal pain that he is having to make sure he is not having colitis or appendicitis.   Patient was rechecked at 3 AM. He was given his test results. He has had minimal urinary output at this point. A third liter of IV fluids was ordered and he also fell like he could do an oral challenge at this point.  Patient was rechecked at 3:45  AM. Was able to drink oral fluids without vomiting or feeling worse. He still has had minimal urinary output. He was given his fourth liter of IV fluids.  Recheck 5:30 AM, has gotten his fourth liter of NS, if feeling better and looks like he feels better. He is ready to go home. No more vomiting while in the ED.    Labs Review Results for orders placed or performed during the hospital encounter of 08/14/14  Comprehensive metabolic panel  Result Value Ref Range   Sodium 139 135 - 145 mmol/L   Potassium 3.9 3.5 - 5.1 mmol/L   Chloride 100 (L) 101 - 111 mmol/L   CO2 27 22 - 32 mmol/L   Glucose, Bld 97 65 - 99 mg/dL   BUN 23 (H) 6 - 20 mg/dL   Creatinine, Ser 1.61 0.61 - 1.24 mg/dL   Calcium 9.5 8.9 - 09.6 mg/dL   Total Protein 8.5 (H) 6.5 - 8.1 g/dL  Albumin 4.6 3.5 - 5.0 g/dL   AST 23 15 - 41 U/L   ALT 18 17 - 63 U/L   Alkaline Phosphatase 80 38 - 126 U/L   Total Bilirubin 0.9 0.3 - 1.2 mg/dL   GFR calc non Af Amer >60 >60 mL/min   GFR calc Af Amer >60 >60 mL/min   Anion gap 12 5 - 15  CBC with Differential  Result Value Ref Range   WBC 5.4 4.0 - 10.5 K/uL   RBC 5.99 (H) 4.22 - 5.81 MIL/uL   Hemoglobin 16.3 13.0 - 17.0 g/dL   HCT 49.4 49.6 - 75.9 %   MCV 79.8 78.0 - 100.0 fL   MCH 27.2 26.0 - 34.0 pg   MCHC 34.1 30.0 - 36.0 g/dL   RDW 16.3 84.6 - 65.9 %   Platelets 182 150 - 400 K/uL   Neutrophils Relative % 63 43 - 77 %   Neutro Abs 3.4 1.7 - 7.7 K/uL   Lymphocytes Relative 23 12 - 46 %   Lymphs Abs 1.2 0.7 - 4.0 K/uL   Monocytes Relative 13 (H) 3 - 12 %   Monocytes Absolute 0.7 0.1 - 1.0 K/uL   Eosinophils Relative 1 0 - 5 %   Eosinophils Absolute 0.1 0.0 - 0.7 K/uL   Basophils Relative 0 0 - 1 %   Basophils Absolute 0.0 0.0 - 0.1 K/uL  Urinalysis, Routine w reflex microscopic (not at [facility])  Result Value Ref Range   Color, Urine YELLOW YELLOW   APPearance CLEAR CLEAR   Specific Gravity, Urine >1.030 (H) 1.005 - 1.030   pH 5.5 5.0 - 8.0   Glucose, UA NEGATIVE  NEGATIVE mg/dL   Hgb urine dipstick NEGATIVE NEGATIVE   Bilirubin Urine SMALL (A) NEGATIVE   Ketones, ur 15 (A) NEGATIVE mg/dL   Protein, ur 30 (A) NEGATIVE mg/dL   Urobilinogen, UA 0.2 0.0 - 1.0 mg/dL   Nitrite NEGATIVE NEGATIVE   Leukocytes, UA NEGATIVE NEGATIVE  Urine microscopic-add on  Result Value Ref Range   Squamous Epithelial / LPF RARE RARE   WBC, UA 0-2 <3 WBC/hpf   RBC / HPF 0-2 <3 RBC/hpf   Bacteria, UA FEW (A) RARE   Urine-Other MUCOUS PRESENT    Laboratory interpretation all normal except elevated BUN consistent with dehydration, although hemoglobin is normal it's higher than his baseline which is also consistent with dehydration, his urine is concentrated consistent with dehydration     Imaging Review Ct Abdomen Pelvis W Contrast  08/14/2014   CLINICAL DATA:  24 year old male with diarrhea and vomiting. Right side abdominal pain. Initial encounter.  EXAM: CT ABDOMEN AND PELVIS WITH CONTRAST  TECHNIQUE: Multidetector CT imaging of the abdomen and pelvis was performed using the standard protocol following bolus administration of intravenous contrast.  CONTRAST:  85mL OMNIPAQUE IOHEXOL 300 MG/ML SOLN, OMNIPAQUE IOHEXOL 300 MG/ML SOLN  COMPARISON:  CT Abdomen and Pelvis 10/28/2006.  FINDINGS: Negative lung bases.  No pericardial or pleural effusion.  Mildly transitional anatomy at S1. No osseous abnormality identified.  No pelvic free fluid. Small volume of fluid in the rectum. Decompressed bladder. Negative sigmoid colon.  Retained solid-appearing stool in the left colon. Negative transverse colon. Oral contrast has reached the hepatic flexure. Negative right colon and appendix. Negative terminal ileum. No dilated small bowel. Contrast distends the stomach. The pylorus appears prominent, but this probably represents a physiologic antral contraction. The duodenum bulb appears normal and there is no surrounding inflammation. The remainder  of the duodenum appears normal.   Liver, gallbladder, spleen, pancreas and adrenal glands are within normal limits. Portal venous system is patent. Renal enhancement is normal. Major arterial structures are patent. No abdominal free fluid. No lymphadenopathy. No inflammatory stranding identified.  IMPRESSION: Negative CT Abdomen and Pelvis.   Electronically Signed   By: Odessa Fleming M.D.   On: 08/14/2014 02:25     EKG Interpretation None      MDM   Final diagnoses:  Nausea vomiting and diarrhea  Dehydration  Abdominal pain, right lateral    New Prescriptions   PROMETHAZINE (PHENERGAN) 25 MG TABLET    Take 1 tablet (25 mg total) by mouth every 6 (six) hours as needed for nausea or vomiting.   Plan discharge  Devoria Albe, MD, FACEP    I personally performed the services described in this documentation, which was scribed in my presence. The recorded information has been reviewed and considered.  Devoria Albe, MD, Concha Pyo, MD 08/14/14 [phone]

## 2014-11-19 ENCOUNTER — Emergency Department (HOSPITAL_COMMUNITY)
Admission: EM | Admit: 2014-11-19 | Discharge: 2014-11-19 | Disposition: A | Discharge status: Home / Self Care | Payer: Self-pay | Attending: Emergency Medicine | Admitting: Emergency Medicine

## 2014-11-19 ENCOUNTER — Encounter (HOSPITAL_COMMUNITY): Payer: Self-pay | Admitting: *Deleted

## 2014-11-19 DIAGNOSIS — Z872 Personal history of diseases of the skin and subcutaneous tissue: Secondary | ICD-10-CM

## 2014-11-19 DIAGNOSIS — L42 Pityriasis rosea: Secondary | ICD-10-CM | POA: Insufficient documentation

## 2014-11-19 MED ORDER — PREDNISONE 10 MG PO TABS: 20.0000 mg | ORAL_TABLET | Freq: Every day | ORAL | Status: DC

## 2014-11-19 MED ORDER — TRIAMCINOLONE ACETONIDE 0.1 % EX CREA: 1.0000 "application " | TOPICAL_CREAM | Freq: Two times a day (BID) | CUTANEOUS | Status: DC

## 2014-11-19 NOTE — ED Notes (Signed)
Rash all over first noticed 3 days ago.

## 2014-11-19 NOTE — ED Provider Notes (Signed)
CSN: [id number]     Arrival date & time 11/19/14  1737 History   First MD Initiated Contact with Patient 11/19/14 1809     Chief Complaint  Patient presents with  . Rash     (Consider location/radiation/quality/duration/timing/severity/associated sxs/prior Treatment) Patient is a 24 y.o. male presenting with rash. The history is provided by the patient.  Rash Location:  Shoulder/arm, torso and leg Shoulder/arm rash location:  L arm and R arm Torso rash location:  Upper back and lower back Leg rash location:  R knee Severity:  Moderate Onset quality:  Gradual Duration:  3 days Timing:  Intermittent Context: not animal contact, not medications, not new detergent/soap and not nuts   Relieved by:  Nothing Worsened by:  Nothing tried Ineffective treatments:  None tried Associated symptoms: no fever, no shortness of breath and not wheezing     Past Medical History  Diagnosis Date  . Eczema    History reviewed. No pertinent past surgical history. Family History  Problem Relation Age of Onset  . Diabetes Mother    Social History  Substance Use Topics  . Smoking status: Never Smoker   . Smokeless tobacco: None  . Alcohol Use: No    Review of Systems  Constitutional: Negative for fever.  Respiratory: Negative for shortness of breath and wheezing.   Skin: Positive for rash.  All other systems reviewed and are negative.     Allergies  Review of patient's allergies indicates no known allergies.  Home Medications   Prior to Admission medications   Medication Sig Start Date End Date Taking? Authorizing Provider  HYDROcodone-acetaminophen (NORCO/VICODIN) 5-325 MG per tablet Take 2 tablets by mouth every 4 (four) hours as needed for moderate pain. 05/05/13   Gilda Crease, MD  predniSONE (DELTASONE) 10 MG tablet Take 2 tablets (20 mg total) by mouth daily. 11/19/14   Ivery Quale, PA-C  promethazine (PHENERGAN) 25 MG tablet Take 1 tablet (25 mg total) by mouth every  6 (six) hours as needed for nausea or vomiting. 08/14/14   Devoria Albe, MD  triamcinolone cream (KENALOG) 0.1 % Apply 1 application topically 2 (two) times daily. 11/19/14   Ivery Quale, PA-C   BP 119/50 mmHg  Pulse 56  Temp(Src) 98.2 F (36.8 C) (Oral)  Resp 18  Ht  (1.702 m)  Wt 160 lb (72.576 kg)  BMI 25.05 kg/m2  SpO2 100% Physical Exam  Constitutional: He is oriented to person, place, and time. He appears well-developed and well-nourished.  Non-toxic appearance.  HENT:  Head: Normocephalic.  Right Ear: Tympanic membrane and external ear normal.  Left Ear: Tympanic membrane and external ear normal.  Eyes: EOM and lids are normal. Pupils are equal, round, and reactive to light.  Neck: Normal range of motion. Neck supple. Carotid bruit is not present.  Cardiovascular: Normal rate, regular rhythm, normal heart sounds, intact distal pulses and normal pulses.   Pulmonary/Chest: Breath sounds normal. No respiratory distress.  Abdominal: Soft. Bowel sounds are normal. There is no tenderness. There is no guarding.  Musculoskeletal: Normal range of motion.  Lymphadenopathy:       Head (right side): No submandibular adenopathy present.       Head (left side): No submandibular adenopathy present.    He has no cervical adenopathy.  Neurological: He is alert and oriented to person, place, and time. He has normal strength. No cranial nerve deficit or sensory deficit.  Skin: Skin is warm and dry. Rash noted.  There are oval  shaped lesions some with the tops scratched off on the back and right flank. Some of these have the distribution of a Christmas tree.  There are oval lesions some with blisters and some without blisters in the center on the right and left wrist, right left arm, and a few on the medial aspect of the knee.  There is a dry scaling papular rash in the antecubital areas on the right and the left, as well as behind the right and left knee.  Psychiatric: He has a normal mood  and affect. His speech is normal.  Nursing note and vitals reviewed.   ED Course  Procedures (including critical care time) Labs Review Labs Reviewed - No data to display  Imaging Review No results found. I have personally reviewed and evaluated these images and lab results as part of my medical decision-making.   EKG Interpretation None      MDM  Vital signs are within normal limits. The rash favors a pityriasis rosea. There is also some evidence of eczema present. The patient will be treated with sterile 8 cream, sterilely tablet, and also Benadryl cream and Benadryl tablet at bedtime for itching if needed. The patient is referred to Dr. Margo Aye for dermatology evaluation if not improving.    Final diagnoses:  Pityriasis rosea  History of eczema    *I have reviewed nursing notes, vital signs, and all appropriate lab and imaging results for this patient.[address], PA-C 11/19/14 1957  [name], MD 11/19/14 2212

## 2014-11-19 NOTE — Discharge Instructions (Signed)
Pityriasis Rosea BENADRYL CREAM/OINTMENT(OTC) MAY BE HELPFUL WITH ITCHING. BENADRYL LIQUID 1 OR 2 TEASPOONS MAY BE HELPFUL FOR ITCHING AT BEDTIME.                                                                                                Pityriasis rosea is a rash which is probably caused by a virus. It generally starts as a scaly, red patch on the trunk (the area of the body that a t-shirt would cover) but does not appear on sun exposed areas. The rash is usually preceded by an initial larger spot called the "herald patch" a week or more before the rest of the rash appears. Generally within one to two days the rash appears rapidly on the trunk, upper arms, and sometimes the upper legs. The rash usually appears as flat, oval patches of scaly pink color. The rash can also be raised and one is able to feel it with a finger. The rash can also be finely crinkled and may slough off leaving a ring of scale around the spot. Sometimes a mild sore throat is present with the rash. It usually affects children and young adults in the spring and autumn. Women are more frequently affected than men. TREATMENT  Pityriasis rosea is a self-limited condition. This means it goes away within 4 to 8 weeks without treatment. The spots may persist for several months, especially in darker-colored skin after the rash has resolved and healed. Benadryl and steroid creams may be used if itching is a problem. SEEK MEDICAL CARE IF:   Your rash does not go away or persists longer than three months.  You develop fever and joint pain.  You develop severe headache and confusion.  You develop breathing difficulty, vomiting and/or extreme weakness. Document Released: 03/20/2001 Document Revised: 05/06/2011 Document Reviewed: 04/08/2008 [facility] Patient Information 2015 [place], [place]. This information is not intended to replace advice given to you by your health care provider. Make sure you discuss any questions you have with your  health care provider.

## 2014-12-16 ENCOUNTER — Encounter (HOSPITAL_COMMUNITY): Payer: Self-pay | Admitting: *Deleted

## 2014-12-16 ENCOUNTER — Emergency Department (HOSPITAL_COMMUNITY)
Admission: EM | Admit: 2014-12-16 | Discharge: 2014-12-16 | Disposition: A | Discharge status: Home / Self Care | Payer: Self-pay | Attending: Emergency Medicine | Admitting: Emergency Medicine

## 2014-12-16 DIAGNOSIS — R109 Unspecified abdominal pain: Secondary | ICD-10-CM | POA: Insufficient documentation

## 2014-12-16 DIAGNOSIS — R1110 Vomiting, unspecified: Secondary | ICD-10-CM | POA: Insufficient documentation

## 2014-12-16 DIAGNOSIS — R197 Diarrhea, unspecified: Secondary | ICD-10-CM | POA: Insufficient documentation

## 2014-12-16 DIAGNOSIS — Z7952 Long term (current) use of systemic steroids: Secondary | ICD-10-CM | POA: Insufficient documentation

## 2014-12-16 DIAGNOSIS — Z872 Personal history of diseases of the skin and subcutaneous tissue: Secondary | ICD-10-CM | POA: Insufficient documentation

## 2014-12-16 MED ORDER — ONDANSETRON 8 MG PO TBDP: ORAL_TABLET | ORAL | Status: DC

## 2014-12-16 MED ORDER — ONDANSETRON 8 MG PO TBDP
8.0000 mg | ORAL_TABLET | Freq: Once | ORAL | Status: AC
  Administered 2014-12-16: 8 mg via ORAL
  Filled 2014-12-16: qty 1

## 2014-12-16 NOTE — ED Notes (Signed)
MD at bedside. 

## 2014-12-16 NOTE — ED Notes (Signed)
Pt woke up with vomiting and diarrhea at 0630 this morning. States vomited x 6 with one episode of loose stools. Cramping to the abdomen.

## 2014-12-16 NOTE — ED Provider Notes (Signed)
CSN: [id number]     Arrival date & time 12/16/14  1191 History  By signing my name below, I, Lyndel Safe, attest that this documentation has been prepared under the direction and in the presence of Zadie Rhine, MD. Electronically Signed: Lyndel Safe, ED Scribe. 12/16/2014. 9:00 AM.   Chief Complaint  Patient presents with  . Emesis   The history is provided by the patient. No language interpreter was used.   HPI Comments: Alexander Acevedo is a 24 y.o. male, with no pertinent PMhx, who presents to the Emergency Department complaining of sudden onset 6 episodes of emesis and 1 episode of diarrhea with waking up 2 hours ago. Pt associates sudden onset, constant abdominal pain that he describes as a cramping pain. He reports feeling well last night when going to bed. Possible sick contact with daughter who has been c/o abdominal pain but no vomiting or diarrhea. He denies headaches, hematochezia, hematemesis, recent travel, or fevers.   Past Medical History  Diagnosis Date  . Eczema    History reviewed. No pertinent past surgical history. Family History  Problem Relation Age of Onset  . Diabetes Mother    Social History  Substance Use Topics  . Smoking status: Never Smoker   . Smokeless tobacco: None  . Alcohol Use: No    Review of Systems  Constitutional: Negative for fever.  Gastrointestinal: Positive for vomiting, abdominal pain and diarrhea. Negative for blood in stool.  Neurological: Negative for headaches.  All other systems reviewed and are negative.  Allergies  Review of patient's allergies indicates no known allergies.  Home Medications   Prior to Admission medications   Medication Sig Start Date End Date Taking? Authorizing Provider  HYDROcodone-acetaminophen (NORCO/VICODIN) 5-325 MG per tablet Take 2 tablets by mouth every 4 (four) hours as needed for moderate pain. 05/05/13   Gilda Crease, MD  predniSONE (DELTASONE) 10 MG tablet Take 2 tablets (20  mg total) by mouth daily. 11/19/14   Ivery Quale, PA-C  promethazine (PHENERGAN) 25 MG tablet Take 1 tablet (25 mg total) by mouth every 6 (six) hours as needed for nausea or vomiting. 08/14/14   Devoria Albe, MD  triamcinolone cream (KENALOG) 0.1 % Apply 1 application topically 2 (two) times daily. 11/19/14   Ivery Quale, PA-C   BP 117/82 mmHg  Pulse 56  Temp(Src) 98.6 F (37 C) (Oral)  Resp 16  Ht  (1.702 m)  Wt 160 lb (72.576 kg)  BMI 25.05 kg/m2  SpO2 100% Physical Exam Nursing notes including past medical history and social history reviewed and considered in documentation CONSTITUTIONAL: Well developed/well nourished HEAD: Normocephalic/atraumatic EYES: EOMI/PERRL, no scleral icterus ENMT: Mucous membranes dry NECK: supple no meningeal signs SPINE/BACK:entire spine nontender CV: S1/S2 noted, no murmurs/rubs/gallops noted LUNGS: Lungs are clear to auscultation bilaterally, no apparent distress ABDOMEN: soft, nontender, no rebound or guarding, bowel sounds noted throughout abdomen GU:no cva tenderness NEURO: Pt is awake/alert/appropriate, moves all extremitiesx4.  No facial droop.   EXTREMITIES: pulses normal/equal, full ROM SKIN: warm, color normal PSYCH: no abnormalities of mood noted, alert and oriented to situation  ED Course  Procedures  DIAGNOSTIC STUDIES: Oxygen Saturation is 100% on RA, normal by my interpretation.    COORDINATION OF CARE: 8:58 AM Discussed treatment plan with pt at bedside and pt agreed to plan. Will order antiemetics.    Medications  ondansetron (ZOFRAN-ODT) disintegrating tablet 8 mg (8 mg Oral Given 12/16/14 0903)    Pt well appearing Taking PO No  vomiting Suspect viral illness He is appropriate for d/c home  MDM   Final diagnoses:  Vomiting and diarrhea    Nursing notes including past medical history and social history reviewed and considered in documentation   I, Joya GaskinsWICKLINE,Olivea Sonnen W, personally performed the services  described in this documentation. All medical record entries made by the scribe were at my direction and in my presence.  I have reviewed the chart and discharge instructions and agree that the record reflects my personal performance and is accurate and complete. Joya GaskinsWICKLINE,Owen Pagnotta W.  12/16/2014. 11:13 AM.        Zadie Rhineonald Kenneth Cuaresma, MD 12/16/14 1114

## 2014-12-16 NOTE — Discharge Instructions (Signed)

## 2015-09-05 ENCOUNTER — Encounter (HOSPITAL_COMMUNITY): Payer: Self-pay | Admitting: Emergency Medicine

## 2015-09-05 ENCOUNTER — Other Ambulatory Visit: Payer: Self-pay

## 2015-09-05 ENCOUNTER — Emergency Department (HOSPITAL_COMMUNITY)
Admission: EM | Admit: 2015-09-05 | Discharge: 2015-09-05 | Disposition: A | Discharge status: Home / Self Care | Payer: Self-pay | Attending: Emergency Medicine | Admitting: Emergency Medicine

## 2015-09-05 DIAGNOSIS — T6594XA Toxic effect of unspecified substance, undetermined, initial encounter: Secondary | ICD-10-CM | POA: Insufficient documentation

## 2015-09-05 DIAGNOSIS — H10212 Acute toxic conjunctivitis, left eye: Secondary | ICD-10-CM

## 2015-09-05 MED ORDER — FLUORESCEIN SODIUM 1 MG OP STRP
ORAL_STRIP | OPHTHALMIC | Status: AC
  Administered 2015-09-05: 1
  Filled 2015-09-05: qty 1

## 2015-09-05 NOTE — ED Notes (Addendum)
Pt reports LT eye redness, pain, and discharge. Pt also report photophobia. Denies injury. Pt's HR noted to be in the 40s with no pmh of bradycardia.

## 2015-09-05 NOTE — Discharge Instructions (Signed)
Keep her contacts out until Thursday. Follow-up with your eye doctor next week if continued problems

## 2015-09-05 NOTE — ED Provider Notes (Signed)
CSN: [id number]     Arrival date & time 09/05/15  1142 History  By signing my name below, I, [facility], attest that this documentation has been prepared under the direction and in the presence of Bethann BerkshireJoseph Tamasha Laplante, MD. Electronically Signed: Randell PatientMarrissa Acevedo, ED Scribe. 09/05/2015. 12:35 PM.    Chief Complaint  Acevedo presents with  . Eye Problem   Acevedo is a 25 y.o. male presenting with eye problem. The history is provided by the Acevedo. No language interpreter was used.  Eye Problem Location:  L eye Quality: redness. Severity:  Mild Onset quality:  Gradual Duration:  1 day Timing:  Constant Progression:  Unchanged Chronicity:  New Context: not foreign body and not tanning booth use   Relieved by:  None tried Worsened by:  Nothing tried Ineffective treatments:  None tried Associated symptoms: discharge (resolved) and redness   Associated symptoms: no blurred vision, no decreased vision, no double vision, no facial rash, no headaches, no itching and no photophobia   Risk factors: no previous injury to eye    HPI Comments: Alexander Acevedo is a 25 y.o. male who presents to the Emergency Department complaining of constant, mild, unchanging left eye redness onset this morning upon waking. Pt states that last night he had left eye pain and watery discharge that both resolved followed by erythema this morning. Per pt, he is a regular contact lenses wearer and occasionally forgets to remove them prior to falling asleep. Denies eye itching or any other symptoms currently.  Past Medical History  Diagnosis Date  . Eczema    History reviewed. No pertinent past surgical history. Family History  Problem Relation Age of Onset  . Diabetes Mother    Social History  Substance Use Topics  . Smoking status: Never Smoker   . Smokeless tobacco: None  . Alcohol Use: Yes     Comment: socially    Review of Systems  Constitutional: Negative for appetite change and fatigue.  HENT:  Negative for congestion, ear discharge and sinus pressure.   Eyes: Positive for pain (resolved), discharge (resolved) and redness. Negative for blurred vision, double vision, photophobia and itching.  Respiratory: Negative for cough.   Cardiovascular: Negative for chest pain.  Gastrointestinal: Negative for abdominal pain and diarrhea.  Genitourinary: Negative for frequency and hematuria.  Musculoskeletal: Negative for back pain.  Skin: Negative for rash.  Neurological: Negative for seizures and headaches.  Psychiatric/Behavioral: Negative for hallucinations.      Allergies  Review of Acevedo's allergies indicates no known allergies.  Home Medications   Prior to Admission medications   Not on File   BP 116/65 mmHg  Pulse 44  Temp(Src) 98 F (36.7 C) (Oral)  Resp 16  Ht 5\' 7"  (1.702 m)  Wt 159 lb (72.122 kg)  BMI 24.90 kg/m2  SpO2 100% Physical Exam  Constitutional: He is oriented to person, place, and time. He appears well-developed.  HENT:  Head: Normocephalic.  Eyes: Conjunctivae are normal. Pupils are equal, round, and reactive to light.  Slit lamp exam:      The left eye shows no corneal abrasion and no fluorescein uptake.  Conjunctiva inflamed, worse on the left. Fluorescein exam negative for corneal abrasion. Funduscopic exam normal.  Neck: No tracheal deviation present.  Cardiovascular:  No murmur heard. Musculoskeletal: Normal range of motion.  Neurological: He is oriented to person, place, and time.  Skin: Skin is warm.  Psychiatric: He has a normal mood and affect.    ED Course  Procedures  DIAGNOSTIC STUDIES: Oxygen Saturation is 100% on RA, normal by my interpretation.    COORDINATION OF CARE: 12:12 PM Will order fluorescein opthalmic strip. Discussed treatment plan with pt at bedside and pt agreed to plan.  12:34 PM Returned to examine left eye.   MDM   Final diagnoses:  Chemical conjunctivitis of left eye   Acevedo had some discomfort in  his left eye yesterday. He wears contacts. He also had some redness and irritation in that yesterday. Acevedo has not worn his contacts today and his symptoms have improved. He no longer has pain or tearing. But does have some redness. Suspect this is inflammation from his contacts. I told the Acevedo to not wear contacts for 2 days then try them again. If he continues to have problems he needs to see his eye doctor   Bethann Berkshire, MD 09/05/15 1238

## 2015-09-10 ENCOUNTER — Emergency Department (HOSPITAL_COMMUNITY): Payer: Self-pay

## 2015-09-10 ENCOUNTER — Emergency Department (HOSPITAL_COMMUNITY)
Admission: EM | Admit: 2015-09-10 | Discharge: 2015-09-10 | Disposition: A | Discharge status: Home / Self Care | Payer: Self-pay | Attending: Emergency Medicine | Admitting: Emergency Medicine

## 2015-09-10 ENCOUNTER — Encounter (HOSPITAL_COMMUNITY): Payer: Self-pay | Admitting: Emergency Medicine

## 2015-09-10 DIAGNOSIS — S9031XA Contusion of right foot, initial encounter: Secondary | ICD-10-CM

## 2015-09-10 DIAGNOSIS — Y9389 Activity, other specified: Secondary | ICD-10-CM | POA: Insufficient documentation

## 2015-09-10 DIAGNOSIS — W51XXXA Accidental striking against or bumped into by another person, initial encounter: Secondary | ICD-10-CM | POA: Insufficient documentation

## 2015-09-10 DIAGNOSIS — Y999 Unspecified external cause status: Secondary | ICD-10-CM | POA: Insufficient documentation

## 2015-09-10 DIAGNOSIS — S92911A Unspecified fracture of right toe(s), initial encounter for closed fracture: Secondary | ICD-10-CM

## 2015-09-10 DIAGNOSIS — Y929 Unspecified place or not applicable: Secondary | ICD-10-CM | POA: Insufficient documentation

## 2015-09-10 DIAGNOSIS — S92411A Displaced fracture of proximal phalanx of right great toe, initial encounter for closed fracture: Secondary | ICD-10-CM | POA: Insufficient documentation

## 2015-09-10 MED ORDER — IBUPROFEN 600 MG PO TABS: 600.0000 mg | ORAL_TABLET | Freq: Four times a day (QID) | ORAL | Status: DC | PRN

## 2015-09-10 MED ORDER — IBUPROFEN 800 MG PO TABS
800.0000 mg | ORAL_TABLET | Freq: Once | ORAL | Status: AC
  Administered 2015-09-10: 800 mg via ORAL
  Filled 2015-09-10: qty 1
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
## 2015-09-10 NOTE — Discharge Instructions (Signed)
Contusion A contusion is a deep bruise. Contusions are the result of a blunt injury to tissues and muscle fibers under the skin. The injury causes bleeding under the skin. The skin overlying the contusion may turn blue, purple, or yellow. Minor injuries will give you a painless contusion, but more severe contusions may stay painful and swollen for a few weeks.  CAUSES  This condition is usually caused by a blow, trauma, or direct force to an area of the body. SYMPTOMS  Symptoms of this condition include:  Swelling of the injured area.  Pain and tenderness in the injured area.  Discoloration. The area may have redness and then turn blue, purple, or yellow. DIAGNOSIS  This condition is diagnosed based on a physical exam and medical history. An X-ray, CT scan, or MRI may be needed to determine if there are any associated injuries, such as broken bones (fractures). TREATMENT  Specific treatment for this condition depends on what area of the body was injured. In general, the best treatment for a contusion is resting, icing, applying pressure to (compression), and elevating the injured area. This is often called the RICE strategy. Over-the-counter anti-inflammatory medicines may also be recommended for pain control.  HOME CARE INSTRUCTIONS   Rest the injured area.  If directed, apply ice to the injured area:  Put ice in a plastic bag.  Place a towel between your skin and the bag.  Leave the ice on for 20 minutes, 2-3 times per day.  If directed, apply light compression to the injured area using an elastic bandage. Make sure the bandage is not wrapped too tightly. Remove and reapply the bandage as directed by your health care provider.  If possible, raise (elevate) the injured area above the level of your heart while you are sitting or lying down.  Take over-the-counter and prescription medicines only as told by your health care provider. SEEK MEDICAL CARE IF:  Your symptoms do not  improve after several days of treatment.  Your symptoms get worse.  You have difficulty moving the injured area. SEEK IMMEDIATE MEDICAL CARE IF:   You have severe pain.  You have numbness in a hand or foot.  Your hand or foot turns pale or cold.   This information is not intended to replace advice given to you by your health care provider. Make sure you discuss any questions you have with your health care provider.   Document Released: 11/21/2004 Document Revised: 11/02/2014 Document Reviewed: 06/29/2014 Elsevier Interactive Patient Education 2016 [employer]  Toe Fracture A toe fracture is a break in one of the toe bones (phalanges). CAUSES This condition may be caused by:  Dropping a heavy object on your toe.  Stubbing your toe.  Overusing your toe or doing repetitive exercise.  Twisting or stretching your toe out of place. RISK FACTORS This condition is more likely to develop in people who:  Play contact sports.  Have a bone disease.  Have a low calcium level. SYMPTOMS The main symptoms of this condition are swelling and pain in the toe. The pain may get worse with standing or walking. Other symptoms include:  Bruising.  Stiffness.  Numbness.  A change in the way the toe looks.  Broken bones that poke through the skin.  Blood beneath the toenail. DIAGNOSIS This condition is diagnosed with a physical exam. You may also have X-rays. TREATMENT  Treatment for this condition depends on the type of fracture and its severity. Treatment may involve:  Taping the  broken toe to a toe that is next to it (buddy taping). This is the most common treatment for fractures in which the bone has not moved out of place (nondisplaced fracture).  Wearing a shoe that has a wide, rigid sole to protect the toe and to limit its movement.  Wearing a walking cast.  Having a procedure to move the toe back into place.  Surgery. This may be needed:  If there are many pieces  of broken bone that are out of place (displaced).  If the toe joint breaks.  If the bone breaks through the skin.  Physical therapy. This is done to help regain movement and strength in the toe. You may need follow-up X-rays to make sure that the bone is healing well and staying in position. HOME CARE INSTRUCTIONS If You Have a Cast:  Do not stick anything inside the cast to scratch your skin. Doing that increases your risk of infection.  Check the skin around the cast every day. Report any concerns to your health care provider. You may put lotion on dry skin around the edges of the cast. Do not apply lotion to the skin underneath the cast.  Do not put pressure on any part of the cast until it is fully hardened. This may take several hours.  Keep the cast clean and dry. Bathing  Do not take baths, swim, or use a hot tub until your health care provider approves. Ask your health care provider if you can take showers. You may only be allowed to take sponge baths for bathing.  If your health care provider approves bathing and showering, cover the cast or bandage (dressing) with a watertight plastic bag to protect it from water. Do not let the cast or dressing get wet. Managing Pain, Stiffness, and Swelling  If you do not have a cast, apply ice to the injured area, if directed.  Put ice in a plastic bag.  Place a towel between your skin and the bag.  Leave the ice on for 20 minutes, 2-3 times per day.  Move your toes often to avoid stiffness and to lessen swelling.  Raise (elevate) the injured area above the level of your heart while you are sitting or lying down. Driving  Do not drive or operate heavy machinery while taking pain medicine.  Do not drive while wearing a cast on a foot that you use for driving. Activity  Return to your normal activities as directed by your health care provider. Ask your health care provider what activities are safe for you.  Perform exercises  daily as directed by your health care provider or physical therapist. Safety  Do not use the injured limb to support your body weight until your health care provider says that you can. Use crutches or other assistive devices as directed by your health care provider. General Instructions  If your toe was treated with buddy taping, follow your health care provider's instructions for changing the gauze and tape. Change it more often:  The gauze and tape get wet. If this happens, dry the space between the toes.  The gauze and tape are too tight and cause your toe to become pale or numb.  Wear a protective shoe as directed by your health care provider. If you were not given a protective shoe, wear sturdy, supportive shoes. Your shoes should not pinch your toes and should not fit tightly against your toes.  Do not use any tobacco products, including cigarettes, chewing tobacco,  or e-cigarettes. Tobacco can delay bone healing. If you need help quitting, ask your health care provider.  Take medicines only as directed by your health care provider.  Keep all follow-up visits as directed by your health care provider. This is important. SEEK MEDICAL CARE IF:  You have a fever.  Your pain medicine is not helping.  Your toe is cold.  Your toe is numb.  You still have pain after one week of rest and treatment.  You still have pain after your health care provider has said that you can start walking again.  You have pain, tingling, or numbness in your foot that is not going away. SEEK IMMEDIATE MEDICAL CARE IF:  You have severe pain.  You have redness or inflammation in your toe that is getting worse.  You have pain or numbness in your toe that is getting worse.  Your toe turns blue.   This information is not intended to replace advice given to you by your health care provider. Make sure you discuss any questions you have with your health care provider.   Document Released: 02/09/2000  Document Revised: 11/02/2014 Document Reviewed: 12/08/2013 Elsevier Interactive Patient Education [employer]
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
## 2015-09-12 ENCOUNTER — Telehealth: Payer: Self-pay | Admitting: Orthopaedic Surgery

## 2015-09-12 NOTE — Telephone Encounter (Signed)
Patient called to inquire about appointment [facility] for problem of right foot (fracture).  Offered appointment; patient relayed unable to schedule until further checks regarding the self-pay amount discussed. States he will call back today or tomorrow, as relayed our doctors will be in then only this week.

## 2015-09-22 NOTE — Telephone Encounter (Signed)
No further response. 

## 2015-09-25 ENCOUNTER — Ambulatory Visit: Payer: Self-pay | Admitting: Physician Assistant

## 2015-09-25 ENCOUNTER — Encounter: Payer: Self-pay | Admitting: Physician Assistant

## 2015-09-25 VITALS — BP 108/58 | HR 66 | Temp 98.2°F | Ht 67.75 in | Wt 158.0 lb

## 2015-09-25 DIAGNOSIS — Z131 Encounter for screening for diabetes mellitus: Secondary | ICD-10-CM

## 2015-09-25 DIAGNOSIS — IMO0001 Reserved for inherently not codable concepts without codable children: Secondary | ICD-10-CM

## 2015-09-25 LAB — GLUCOSE, POCT (MANUAL RESULT ENTRY): POC Glucose: 113 mg/dl — AB (ref 70–99)

## 2015-09-25 NOTE — Progress Notes (Signed)
BP (!) 108/58 (BP Location: Left Arm, Patient Position: Sitting, Cuff Size: Normal)   Pulse 66   Temp 98.2 F (36.8 C)   Ht 5' 7.75" (1.721 m)   Wt 158 lb (71.7 kg)   SpO2 99%   BMI 24.20 kg/m    Subjective:    Patient ID: Alexander Acevedo, male    DOB: [birth year], 24 y.o.   MRN: [id number]  HPI: Alexander Acevedo is a 25 y.o. male presenting on 09/25/2015 for New Patient (Initial Visit)   HPI   Pt states no PCP.  He wants to be seen for his toe.  Went to ER for it about 3 wk ago  Pt works at [employer]  Relevant past medical, surgical, family and social history reviewed and updated as indicated. Interim medical history since our last visit reviewed. Allergies and medications reviewed and updated.  No current outpatient prescriptions on file.   Review of Systems  Constitutional: Negative for appetite change, chills, diaphoresis, fatigue, fever and unexpected weight change.  HENT: Negative for congestion, drooling, ear pain, facial swelling, hearing loss, mouth sores, sneezing, sore throat, trouble swallowing and voice change.   Eyes: Positive for redness. Negative for pain, discharge, itching and visual disturbance.  Respiratory: Negative for cough, choking, shortness of breath and wheezing.   Cardiovascular: Negative for chest pain, palpitations and leg swelling.  Gastrointestinal: Negative for abdominal pain, blood in stool, constipation, diarrhea and vomiting.  Endocrine: Negative for cold intolerance, heat intolerance and polydipsia.  Genitourinary: Negative for decreased urine volume, dysuria and hematuria.  Musculoskeletal: Negative for arthralgias, back pain and gait problem.  Skin: Negative for rash.  Allergic/Immunologic: Negative for environmental allergies.  Neurological: Negative for seizures, syncope, light-headedness and headaches.  Hematological: Negative for adenopathy.  Psychiatric/Behavioral: Negative for agitation, dysphoric mood and suicidal ideas. The patient  is not nervous/anxious.     Per HPI unless specifically indicated above     Objective:    BP (!) 108/58 (BP Location: Left Arm, Patient Position: Sitting, Cuff Size: Normal)   Pulse 66   Temp 98.2 F (36.8 C)   Ht 5' 7.75" (1.721 m)   Wt 158 lb (71.7 kg)   SpO2 99%   BMI 24.20 kg/m   Wt Readings from Last 3 Encounters:  09/25/15 158 lb (71.7 kg)  09/05/15 159 lb (72.1 kg)  12/16/14 160 lb (72.6 kg)    Physical Exam  Constitutional: He is oriented to person, place, and time. He appears well-developed and well-nourished.  HENT:  Head: Normocephalic and atraumatic.  Mouth/Throat: Oropharynx is clear and moist. No oropharyngeal exudate.  Eyes: Conjunctivae and EOM are normal. Pupils are equal, round, and reactive to light.  Neck: Neck supple. No thyromegaly present.  Cardiovascular: Normal rate and regular rhythm.   Pulmonary/Chest: Effort normal and breath sounds normal. He has no wheezes. He has no rales.  Abdominal: Soft. Bowel sounds are normal. He exhibits no mass. There is no hepatosplenomegaly. There is no tenderness.  Musculoskeletal: He exhibits no edema.       Right foot: Normal. There is normal range of motion, no tenderness, no bony tenderness and no swelling.  Great toe nontender  Lymphadenopathy:    He has no cervical adenopathy.  Neurological: He is alert and oriented to person, place, and time.  Skin: Skin is warm and dry. No rash noted.  Psychiatric: He has a normal mood and affect. His behavior is normal. Thought content normal.  Vitals reviewed.   Results for  orders placed or performed in visit on 09/25/15  POCT Glucose (CBG)  Result Value Ref Range   POC Glucose 113 (A) 70 - 99 mg/dl      Assessment & Plan:   Encounter Diagnoses  Name Primary?  . Well adult Yes  . Screening for diabetes mellitus (DM)     -reviewed xray done in ER with pt -Recommended pt avoid MMA until he gets insurance -F/u 1 year. RTO sooner prn

## 2015-11-21 IMAGING — CT CT ABD-PELV W/ CM
2 of 4 series · 15 of 46 positions shown, 17 images · IV contrast (Omnipaque 300)
Comparison: CT Abdomen and Pelvis 10/28/2006.

CLINICAL DATA: 23-year-old male with diarrhea and vomiting. Right
side abdominal pain. Initial encounter.

EXAM:
CT ABDOMEN AND PELVIS WITH CONTRAST
TECHNIQUE: Multidetector CT imaging of the abdomen and pelvis was performed
using the standard protocol following bolus administration of
intravenous contrast.
CONTRAST:  50mL OMNIPAQUE IOHEXOL 300 MG/ML SOLN, 100mL OMNIPAQUE
IOHEXOL 300 MG/ML SOLN

[Series 2: abd_pel_with 5.0 b40f · axial · 0.68mm/px · z∈[-482,-52]mm · 12 of 94 slices shown, 14 images]
[im 4/94  soft-tissue]
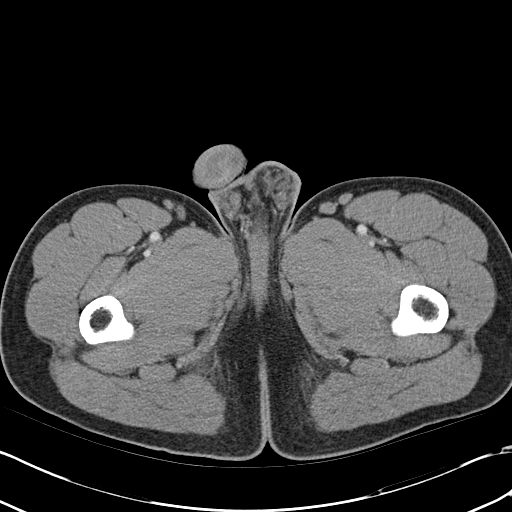
[im 4/94  bone]
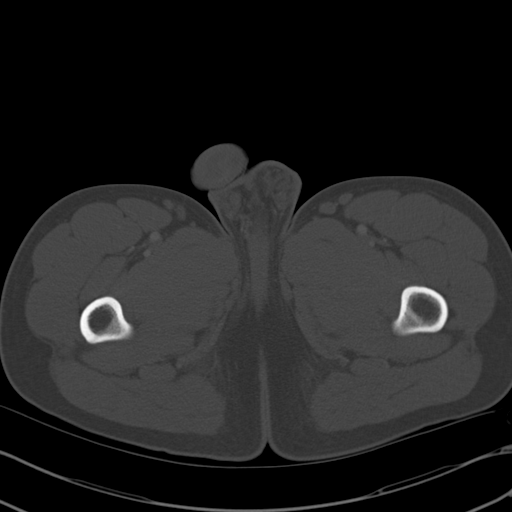
[im 12/94  soft-tissue]
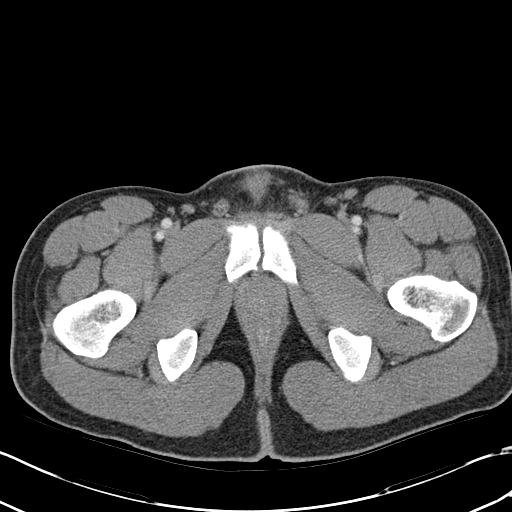
[im 20/94  soft-tissue]
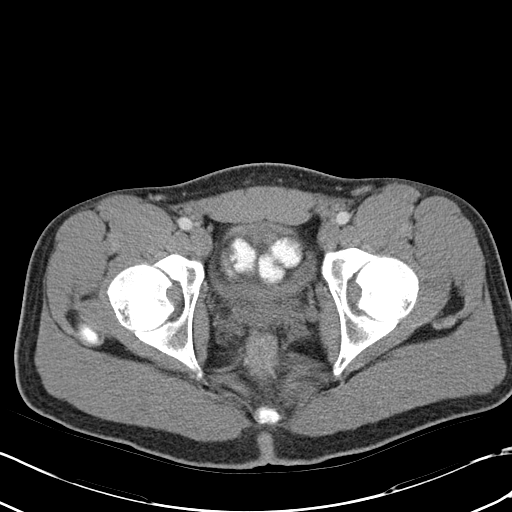
[im 28/94  soft-tissue]
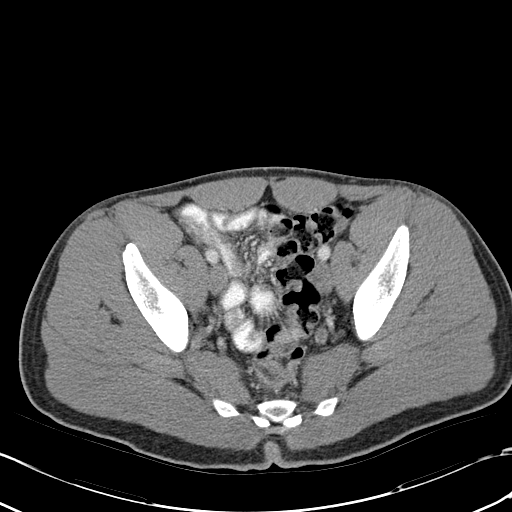
[im 35/94  soft-tissue]
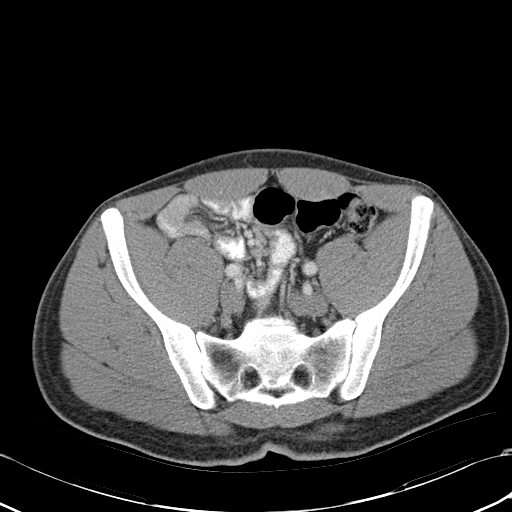
[im 43/94  soft-tissue]
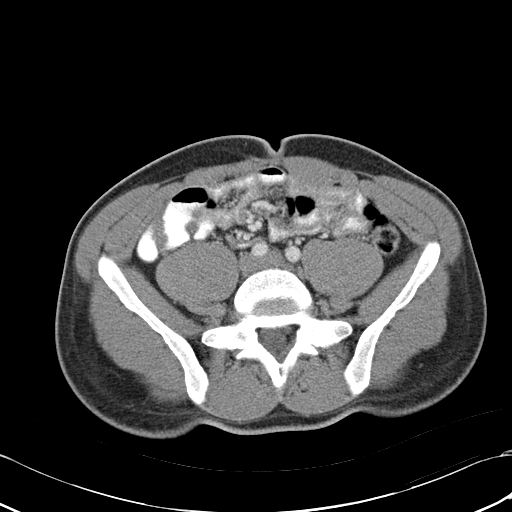
[im 51/94  soft-tissue]
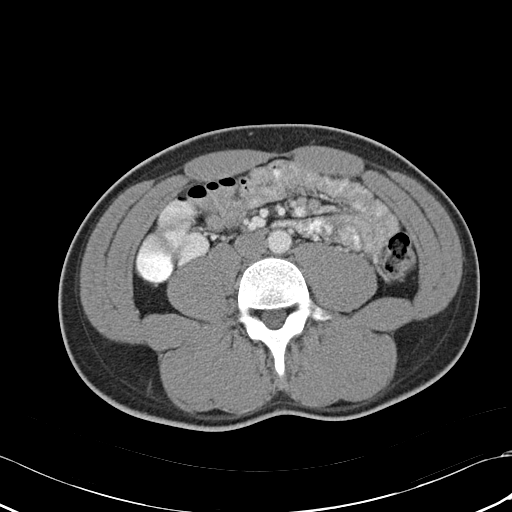
[im 59/94  soft-tissue]
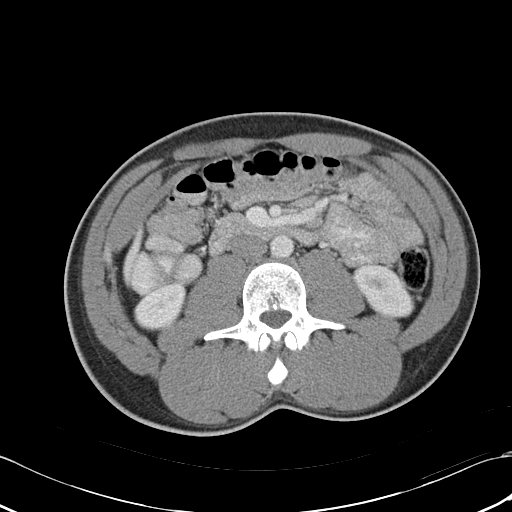
[im 66/94  soft-tissue]
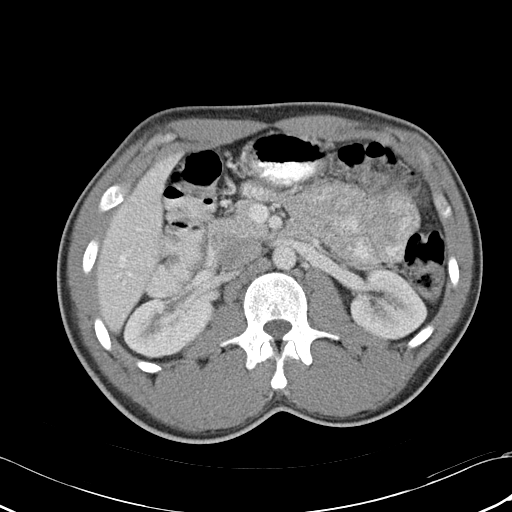
[im 66/94  bone]
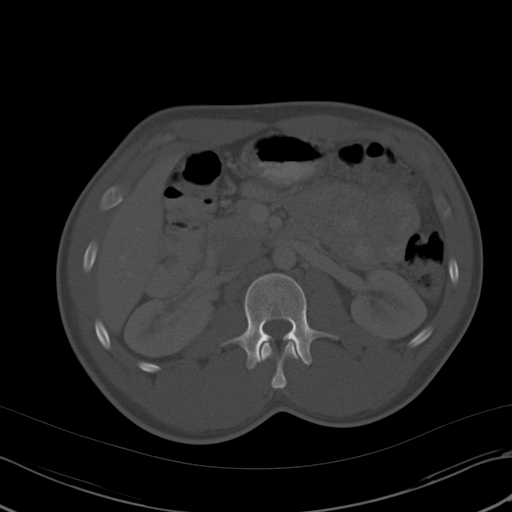
[im 74/94  soft-tissue]
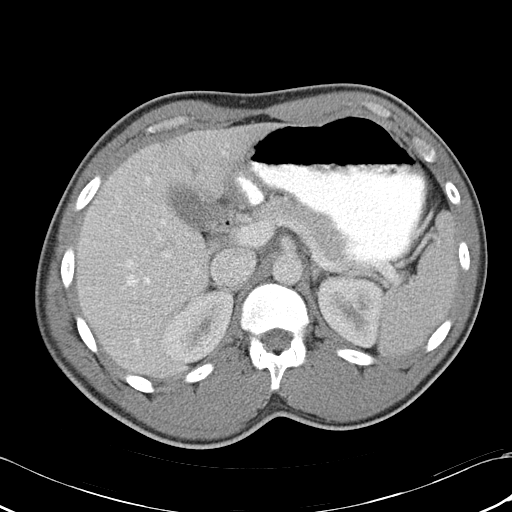
[im 82/94  soft-tissue]
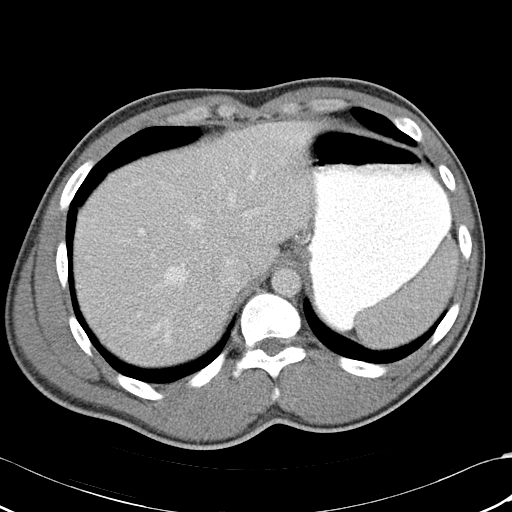
[im 90/94  soft-tissue]
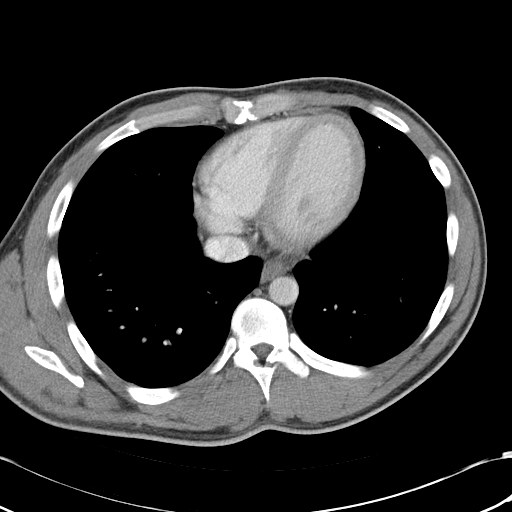

[Series 3: abd_pel_with 3.0 spo cor · coronal · 0.61mm/px · 3 of 71 slices shown]
[im 24/71  soft-tissue]
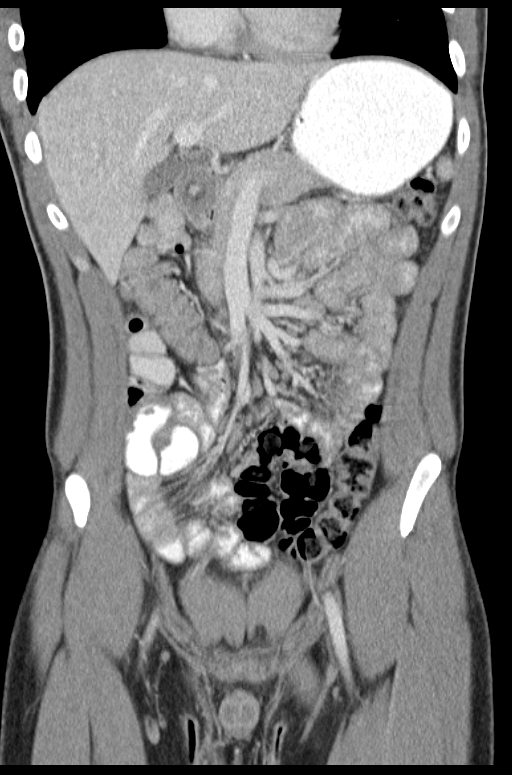
[im 32/71  soft-tissue]
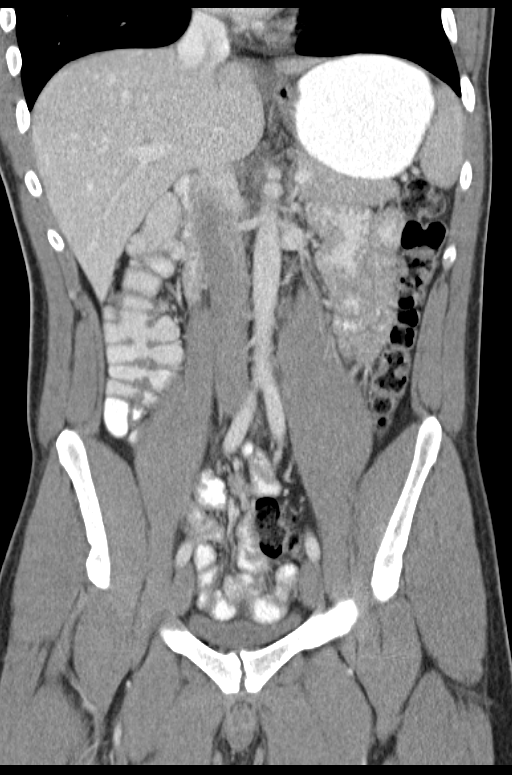
[im 39/71  soft-tissue]
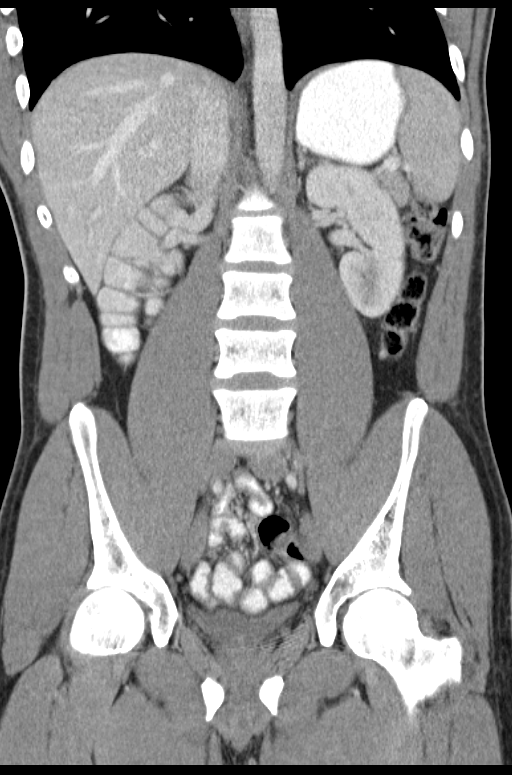

[15 of 46 positions shown; findings below may reference images not displayed]

FINDINGS: Negative lung bases.  No pericardial or pleural effusion.

Mildly transitional anatomy at S1. No osseous abnormality
identified.

No pelvic free fluid. Small volume of fluid in the rectum.
Decompressed bladder. Negative sigmoid colon.

Retained solid-appearing stool in the left colon. Negative
transverse colon. Oral contrast has reached the hepatic flexure.
Negative right colon and appendix. Negative terminal ileum. No
dilated small bowel. Contrast distends the stomach. The pylorus
appears prominent, but this probably represents a physiologic antral
contraction. The duodenum bulb appears normal and there is no
surrounding inflammation. The remainder of the duodenum appears
normal.

Liver, gallbladder, spleen, pancreas and adrenal glands are within
normal limits. Portal venous system is patent. Renal enhancement is
normal. Major arterial structures are patent. No abdominal free
fluid. No lymphadenopathy. No inflammatory stranding identified.
IMPRESSION: Negative CT Abdomen and Pelvis.

## 2016-09-24 ENCOUNTER — Ambulatory Visit: Payer: Self-pay | Admitting: Physician Assistant

## 2016-09-26 ENCOUNTER — Ambulatory Visit: Payer: Self-pay | Admitting: Physician Assistant

## 2016-10-10 ENCOUNTER — Encounter: Payer: Self-pay | Admitting: Physician Assistant
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
## 2017-06-02 ENCOUNTER — Encounter (HOSPITAL_COMMUNITY): Payer: Self-pay

## 2017-06-02 ENCOUNTER — Other Ambulatory Visit: Payer: Self-pay

## 2017-06-02 ENCOUNTER — Emergency Department (HOSPITAL_COMMUNITY)
Admission: EM | Admit: 2017-06-02 | Discharge: 2017-06-02 | Disposition: A | Discharge status: Home / Self Care | Payer: Self-pay | Attending: Emergency Medicine | Admitting: Emergency Medicine

## 2017-06-02 DIAGNOSIS — K029 Dental caries, unspecified: Secondary | ICD-10-CM | POA: Insufficient documentation

## 2017-06-02 DIAGNOSIS — K047 Periapical abscess without sinus: Secondary | ICD-10-CM | POA: Insufficient documentation

## 2017-06-02 MED ORDER — AMOXICILLIN 250 MG PO CAPS
500.0000 mg | ORAL_CAPSULE | Freq: Once | ORAL | Status: AC
  Administered 2017-06-02: 500 mg via ORAL
  Filled 2017-06-02: qty 2

## 2017-06-02 MED ORDER — AMOXICILLIN 500 MG PO CAPS: 500.0000 mg | ORAL_CAPSULE | Freq: Three times a day (TID) | ORAL | 0 refills | Status: AC

## 2017-06-02 MED ORDER — TRAMADOL HCL 50 MG PO TABS
50.0000 mg | ORAL_TABLET | Freq: Once | ORAL | Status: AC
  Administered 2017-06-02: 50 mg via ORAL
  Filled 2017-06-02: qty 1

## 2017-06-02 MED ORDER — TRAMADOL HCL 50 MG PO TABS: 50.0000 mg | ORAL_TABLET | Freq: Four times a day (QID) | ORAL | 0 refills | Status: AC | PRN

## 2017-06-02 NOTE — Discharge Instructions (Addendum)
Complete your entire course of antibiotics as prescribed.  You  may use the tramadol for pain relief but do not drive within 4 hours of taking as this will make you drowsy.  Avoid applying heat or ice to this abscess area which can worsen your symptoms.  You may use warm salt water swish and spit treatment or half peroxide and water swish and spit after meals to keep this area clean as discussed.  Call the dentist listed above for further management of your symptoms. ° ° °

## 2017-06-02 NOTE — ED Provider Notes (Signed)
[facility] EMERGENCY DEPARTMENT Provider Note   CSN: [id number] Arrival date & time: 06/02/17  [id number]     History   Chief Complaint Chief Complaint  Patient presents with  . Dental Pain    HPI Ria Bushnthony Q Bluestein is a 27 y.o. male presenting with a one week history of dental pain radiating into his left cheek.   The patient has a history of decay in the tooth involved which has recently started to cause increased  pain.  There has been no fevers, chills, nausea or vomiting, also no complaint of difficulty swallowing, although chewing makes pain worse.  The patient has tried ambesol and ibuprofen without relief of symptoms.    .  The history is provided by the patient.    Past Medical History:  Diagnosis Date  . Eczema     There are no active problems to display for this patient.   History reviewed. No pertinent surgical history.      Home Medications    Prior to Admission medications   Medication Sig Start Date End Date Taking? Authorizing Provider  ibuprofen (ADVIL,MOTRIN) 800 MG tablet Take 800 mg by mouth every 8 (eight) hours as needed.   Yes [provider]  amoxicillin (AMOXIL) 500 MG capsule Take 1 capsule (500 mg total) by mouth 3 (three) times daily for 10 days. 06/02/17 06/12/17  Burgess AmorIdol, Alan Riles, PA-C  traMADol (ULTRAM) 50 MG tablet Take 1 tablet (50 mg total) by mouth every 6 (six) hours as needed. 06/02/17   Burgess AmorIdol, Cornelio Parkerson, PA-C    Family History Family History  Problem Relation Age of Onset  . Hypertension Mother     Social History Social History   Tobacco Use  . Smoking status: Never Smoker  . Smokeless tobacco: Never Used  Substance Use Topics  . Alcohol use: Yes    Comment: socially  . Drug use: No     Allergies   Patient has no known allergies.   Review of Systems Review of Systems  Constitutional: Negative for fever.  HENT: Positive for dental problem. Negative for facial swelling and sore throat.   Respiratory: Negative for shortness  of breath.   Musculoskeletal: Negative for neck pain and neck stiffness.     Physical Exam Updated Vital Signs BP 128/78 (BP Location: Right Arm)   Pulse (!) 48   Temp 98 F (36.7 C) (Oral)   Resp 18   Ht 5\' 9"  (1.753 m)   Wt 72.6 kg (160 lb)   SpO2 99%   BMI 23.63 kg/m   Physical Exam  Constitutional: He is oriented to person, place, and time. He appears well-developed and well-nourished. No distress.  HENT:  Head: Normocephalic and atraumatic.  Right Ear: Tympanic membrane and external ear normal.  Left Ear: Tympanic membrane and external ear normal.  Mouth/Throat: Oropharynx is clear and moist and mucous membranes are normal. No oral lesions. No trismus in the jaw. Dental abscesses present.    Eyes: Conjunctivae are normal.  Neck: Normal range of motion. Neck supple.  Cardiovascular: Normal rate and normal heart sounds.  Pulmonary/Chest: Effort normal.  Abdominal: He exhibits no distension.  Musculoskeletal: Normal range of motion.  Lymphadenopathy:    He has no cervical adenopathy.  Neurological: He is alert and oriented to person, place, and time.  Skin: Skin is warm and dry. No erythema.  Psychiatric: He has a normal mood and affect.     ED Treatments / Results  Labs (all labs ordered are listed, but  only abnormal results are displayed) Labs Reviewed - No data to display  EKG None  Radiology No results found.  Procedures Procedures (including critical care time)  Medications Ordered in ED Medications  traMADol (ULTRAM) tablet 50 mg (50 mg Oral Given 06/02/17 1003)  amoxicillin (AMOXIL) capsule 500 mg (500 mg Oral Given 06/02/17 1003)     Initial Impression / Assessment and Plan / ED Course  I have reviewed the triage vital signs and the nursing notes.  Pertinent labs & imaging results that were available during my care of the patient were reviewed by me and considered in my medical decision making (see chart for details).     Probable early  dental infection.  He was placed on Amoxil.  He was given referrals for follow-up dental care.  He has no trismus, no drainable abscess at this time.  Final Clinical Impressions(s) / ED Diagnoses   Final diagnoses:  Dental infection  Dental cavity    ED Discharge Orders        Ordered    amoxicillin (AMOXIL) 500 MG capsule  3 times daily     06/02/17 0946    traMADol (ULTRAM) 50 MG tablet  Every 6 hours PRN     06/02/17 0946       Burgess Amor, PA-C 06/02/17 1015    Samuel Jester, DO 06/06/17 [phone]

## 2017-06-02 NOTE — ED Triage Notes (Signed)
Pt c/o pain in left side of mouth x 1 week.  Reports his lower gum hurts.

## 2019-02-02 ENCOUNTER — Other Ambulatory Visit: Payer: Self-pay

## 2019-02-02 DIAGNOSIS — Z20822 Contact with and (suspected) exposure to covid-19: Secondary | ICD-10-CM

## 2019-02-03 LAB — NOVEL CORONAVIRUS, NAA: SARS-CoV-2, NAA: NOT DETECTED

## 2019-02-04 ENCOUNTER — Telehealth: Payer: Self-pay | Admitting: General Practice

## 2019-02-04 NOTE — Telephone Encounter (Signed)
Negative COVID results given. Patient results "NOT Detected." Caller expressed understanding. ° °

## 2022-06-26 ENCOUNTER — Ambulatory Visit
Admission: RE | Admit: 2022-06-26 | Discharge: 2022-06-26 | Disposition: A | Discharge status: Home / Self Care | Payer: No Typology Code available for payment source | Source: Ambulatory Visit | Attending: Anesthesiology | Admitting: Anesthesiology

## 2022-06-26 ENCOUNTER — Other Ambulatory Visit: Payer: Self-pay | Admitting: Anesthesiology

## 2022-06-26 DIAGNOSIS — Z0000 Encounter for general adult medical examination without abnormal findings: Secondary | ICD-10-CM
